# Patient Record
Sex: Female | Born: 1960 | Race: White | Hispanic: No | Smoking: Former smoker
Health system: Southern US, Community
[De-identification: ages and names within clinical notes are randomized; demographics above are authoritative.]

## PROBLEM LIST (undated history)

## (undated) DIAGNOSIS — F32A Depression, unspecified: Secondary | ICD-10-CM

## (undated) DIAGNOSIS — F419 Anxiety disorder, unspecified: Secondary | ICD-10-CM

## (undated) DIAGNOSIS — G43909 Migraine, unspecified, not intractable, without status migrainosus: Secondary | ICD-10-CM

## (undated) DIAGNOSIS — N920 Excessive and frequent menstruation with regular cycle: Secondary | ICD-10-CM

## (undated) DIAGNOSIS — F329 Major depressive disorder, single episode, unspecified: Secondary | ICD-10-CM

## (undated) HISTORY — PX: TUBAL LIGATION: SHX77

## (undated) HISTORY — PX: DILATION AND CURETTAGE OF UTERUS: SHX78

---

## 2001-11-04 ENCOUNTER — Ambulatory Visit (HOSPITAL_COMMUNITY): Admission: RE | Admit: 2001-11-04 | Discharge: 2001-11-04 | Payer: Self-pay | Admitting: Family Medicine

## 2001-11-04 ENCOUNTER — Encounter: Payer: Self-pay | Admitting: Family Medicine

## 2007-08-29 ENCOUNTER — Emergency Department (HOSPITAL_COMMUNITY): Admission: EM | Admit: 2007-08-29 | Discharge: 2007-08-29 | Payer: Self-pay | Admitting: Emergency Medicine

## 2008-11-27 ENCOUNTER — Emergency Department (HOSPITAL_COMMUNITY): Admission: EM | Admit: 2008-11-27 | Discharge: 2008-11-27 | Payer: Self-pay | Admitting: Emergency Medicine

## 2008-11-29 ENCOUNTER — Emergency Department (HOSPITAL_COMMUNITY): Admission: EM | Admit: 2008-11-29 | Discharge: 2008-11-29 | Payer: Self-pay | Admitting: Emergency Medicine

## 2009-11-12 ENCOUNTER — Encounter (HOSPITAL_COMMUNITY): Admission: RE | Admit: 2009-11-12 | Discharge: 2009-12-12 | Payer: Self-pay | Admitting: Preventative Medicine

## 2010-01-03 ENCOUNTER — Ambulatory Visit (HOSPITAL_COMMUNITY): Admission: RE | Admit: 2010-01-03 | Discharge: 2010-01-03 | Payer: Self-pay | Admitting: Internal Medicine

## 2010-06-08 ENCOUNTER — Encounter: Payer: Self-pay | Admitting: Internal Medicine

## 2010-07-31 ENCOUNTER — Inpatient Hospital Stay (INDEPENDENT_AMBULATORY_CARE_PROVIDER_SITE_OTHER)
Admission: RE | Admit: 2010-07-31 | Discharge: 2010-07-31 | Disposition: A | Discharge status: Home / Self Care | Payer: 59 | Source: Ambulatory Visit | Attending: Family Medicine | Admitting: Family Medicine

## 2010-07-31 DIAGNOSIS — N390 Urinary tract infection, site not specified: Secondary | ICD-10-CM

## 2010-07-31 LAB — POCT URINALYSIS DIPSTICK
Bilirubin Urine: NEGATIVE
Glucose, UA: NEGATIVE mg/dL
Hgb urine dipstick: NEGATIVE
Ketones, ur: NEGATIVE mg/dL
Nitrite: NEGATIVE
Protein, ur: NEGATIVE mg/dL
Specific Gravity, Urine: 1.01 (ref 1.005–1.030)
Urobilinogen, UA: 0.2 mg/dL (ref 0.0–1.0)
pH: 6 (ref 5.0–8.0)

## 2010-08-01 LAB — URINE CULTURE
Colony Count: NO GROWTH
Culture  Setup Time: 201203151457
Culture: NO GROWTH

## 2011-02-10 LAB — CBC
HCT: 30.4 — ABNORMAL LOW
Hemoglobin: 10.5 — ABNORMAL LOW
MCHC: 34.5
MCV: 76.8 — ABNORMAL LOW
Platelets: 181
RBC: 3.95
RDW: 14.9
WBC: 7.4

## 2011-02-10 LAB — URINALYSIS, ROUTINE W REFLEX MICROSCOPIC
Bilirubin Urine: NEGATIVE
Glucose, UA: NEGATIVE
Hgb urine dipstick: NEGATIVE
Ketones, ur: NEGATIVE
Nitrite: NEGATIVE
Protein, ur: NEGATIVE
Specific Gravity, Urine: 1.025
Urobilinogen, UA: 0.2
pH: 6

## 2011-02-10 LAB — COMPREHENSIVE METABOLIC PANEL
ALT: 16
AST: 23
Albumin: 3.4 — ABNORMAL LOW
Alkaline Phosphatase: 68
BUN: 13
CO2: 25
Calcium: 8.9
Chloride: 103
Creatinine, Ser: 0.69
GFR calc Af Amer: >60
GFR calc non Af Amer: >60
Glucose, Bld: 101 — ABNORMAL HIGH
Potassium: 3.5
Sodium: 135
Total Bilirubin: 0.6
Total Protein: 6.8

## 2011-02-10 LAB — DIFFERENTIAL
Basophils Absolute: 0
Basophils Relative: 0
Eosinophils Absolute: 0.1
Eosinophils Relative: 1
Lymphocytes Relative: 19
Lymphs Abs: 1.4
Monocytes Absolute: 0.7
Monocytes Relative: 9
Neutro Abs: 5.2
Neutrophils Relative %: 70

## 2011-03-06 ENCOUNTER — Emergency Department (HOSPITAL_COMMUNITY)
Admission: EM | Admit: 2011-03-06 | Discharge: 2011-03-06 | Disposition: A | Discharge status: Home / Self Care | Payer: 59 | Attending: Emergency Medicine | Admitting: Emergency Medicine

## 2011-03-06 DIAGNOSIS — M542 Cervicalgia: Secondary | ICD-10-CM | POA: Insufficient documentation

## 2011-03-06 DIAGNOSIS — M62838 Other muscle spasm: Secondary | ICD-10-CM | POA: Insufficient documentation

## 2011-11-04 ENCOUNTER — Encounter (HOSPITAL_COMMUNITY): Payer: Self-pay

## 2011-11-04 ENCOUNTER — Inpatient Hospital Stay (HOSPITAL_COMMUNITY): Payer: 59

## 2011-11-04 ENCOUNTER — Inpatient Hospital Stay (HOSPITAL_COMMUNITY)
Admission: AD | Admit: 2011-11-04 | Discharge: 2011-11-04 | Disposition: A | Discharge status: Home / Self Care | Payer: 59 | Source: Ambulatory Visit | Attending: Obstetrics & Gynecology | Admitting: Obstetrics & Gynecology

## 2011-11-04 DIAGNOSIS — D649 Anemia, unspecified: Secondary | ICD-10-CM | POA: Insufficient documentation

## 2011-11-04 DIAGNOSIS — N920 Excessive and frequent menstruation with regular cycle: Secondary | ICD-10-CM | POA: Insufficient documentation

## 2011-11-04 DIAGNOSIS — N921 Excessive and frequent menstruation with irregular cycle: Secondary | ICD-10-CM

## 2011-11-04 DIAGNOSIS — D259 Leiomyoma of uterus, unspecified: Secondary | ICD-10-CM | POA: Insufficient documentation

## 2011-11-04 HISTORY — DX: Major depressive disorder, single episode, unspecified: F32.9

## 2011-11-04 HISTORY — DX: Depression, unspecified: F32.A

## 2011-11-04 HISTORY — DX: Anxiety disorder, unspecified: F41.9

## 2011-11-04 HISTORY — DX: Migraine, unspecified, not intractable, without status migrainosus: G43.909

## 2011-11-04 LAB — CBC
HCT: 32.9 % — ABNORMAL LOW (ref 36.0–46.0)
Hemoglobin: 10.7 g/dL — ABNORMAL LOW (ref 12.0–15.0)
MCH: 27.3 pg (ref 26.0–34.0)
MCHC: 32.5 g/dL (ref 30.0–36.0)
MCV: 83.9 fL (ref 78.0–100.0)
Platelets: 218 10*3/uL (ref 150–400)
RBC: 3.92 MIL/uL (ref 3.87–5.11)
RDW: 14.2 % (ref 11.5–15.5)
WBC: 4.9 10*3/uL (ref 4.0–10.5)

## 2011-11-04 LAB — WET PREP, GENITAL
Clue Cells Wet Prep HPF POC: NONE SEEN
Trich, Wet Prep: NONE SEEN
Yeast Wet Prep HPF POC: NONE SEEN

## 2011-11-04 MED ORDER — ONDANSETRON 8 MG PO TBDP
8.0000 mg | ORAL_TABLET | Freq: Once | ORAL | Status: AC
  Administered 2011-11-04: 8 mg via ORAL
  Filled 2011-11-04: qty 1

## 2011-11-04 MED ORDER — IBUPROFEN 600 MG PO TABS: 600.0000 mg | ORAL_TABLET | Freq: Four times a day (QID) | ORAL | Status: AC | PRN

## 2011-11-04 MED ORDER — KETOROLAC TROMETHAMINE 60 MG/2ML IM SOLN
60.0000 mg | Freq: Once | INTRAMUSCULAR | Status: AC
  Administered 2011-11-04: 60 mg via INTRAMUSCULAR
  Filled 2011-11-04: qty 2

## 2011-11-04 MED ORDER — OXYCODONE-ACETAMINOPHEN 5-325 MG PO TABS
1.0000 | ORAL_TABLET | Freq: Once | ORAL | Status: AC
  Administered 2011-11-04: 1 via ORAL
  Filled 2011-11-04: qty 1

## 2011-11-04 MED ORDER — MEDROXYPROGESTERONE ACETATE 10 MG PO TABS: 10.0000 mg | ORAL_TABLET | Freq: Every day | ORAL | Status: DC

## 2011-11-04 MED ORDER — OXYCODONE-ACETAMINOPHEN 5-325 MG PO TABS: 2.0000 | ORAL_TABLET | Freq: Four times a day (QID) | ORAL | Status: AC | PRN

## 2011-11-04 NOTE — Discharge Instructions (Signed)
Continue over the counter iron tablets you have been taking.  Get your prescriptions filled and take as directed  Expect the GYN clinic to call you with an appointment.  Drink at least 8 8-oz glasses of water every day.

## 2011-11-04 NOTE — MAU Provider Note (Signed)
History     CSN: [id number]  Arrival date and time: 11/04/11 1030   First Provider Initiated Contact with Patient 11/04/11 1053      Chief Complaint  Patient presents with  . Vaginal Bleeding   HPI Krista Osborn 51 y.o. Comes to MAU with continual vaginal bleeding since before May 26.  Has a primary care MD who has tried Provera for her and the bleeding did slow but has not stopped.  Finished 10 days of provera and bleeding was worse this week so client came to MAU as bleeding was concerning to her.    OB History    Grav Para Term Preterm Abortions TAB SAB Ect Mult Living   5 4   1  1   4       Past Medical History  Diagnosis Date  . Depression   . Migraines   . Anxiety     Past Surgical History  Procedure Date  . Dilation and curettage of uterus   . Tubal ligation     No family history on file.  History  Substance Use Topics  . Smoking status: Former Games developer  . Smokeless tobacco: Not on file  . Alcohol Use:     Allergies: No Known Allergies  Prescriptions prior to admission  Medication Sig Dispense Refill  . ALPRAZolam (XANAX) 0.5 MG tablet Take 0.5 mg by mouth at bedtime as needed. Takes for anxiety      . citalopram (CELEXA) 20 MG tablet Take 20 mg by mouth daily.      Marland Kitchen oxyCODONE-acetaminophen (PERCOCET) 5-325 MG per tablet Take 1 tablet by mouth every 4 (four) hours as needed. Takes for pain        Review of Systems  Gastrointestinal: Positive for nausea and abdominal pain. Negative for vomiting.  Genitourinary:       Vaginal bleeding   Physical Exam   Blood pressure 105/74, pulse 108, temperature 98.5 F (36.9 C), temperature source Oral, resp. rate 18, height 5' 7.75" (1.721 m), weight 166 lb 9.6 oz (75.569 kg), last menstrual period 10/12/2011.  Physical Exam  Nursing note and vitals reviewed. Constitutional: She is oriented to person, place, and time. She appears well-developed and well-nourished.  HENT:  Head: Normocephalic.  Eyes: EOM  are normal.  Neck: Neck supple.  GI: Soft. There is tenderness. There is no rebound and no guarding.       Mild low midline tenderness  Genitourinary:       Speculum exam: Vagina - small to mod amount of bleeding, no odor Cervix - No active bleeding Bimanual exam: Cervix closed Uterus tender to palpation, retroverted and difficult to size Adnexa non tender, no masses bilaterally GC/Chlam, wet prep done Chaperone present for exam.  Musculoskeletal: Normal range of motion.  Neurological: She is alert and oriented to person, place, and time.  Skin: Skin is warm and dry.  Psychiatric: She has a normal mood and affect.    MAU Course  Procedures Results for orders placed during the hospital encounter of 11/04/11 (from the past 24 hour(s))  CBC     Status: Abnormal   Collection Time   11/04/11 11:06 AM      Component Value Range   WBC 4.9  4.0 - 10.5 K/uL   RBC 3.92  3.87 - 5.11 MIL/uL   Hemoglobin 10.7 (*) 12.0 - 15.0 g/dL   HCT 40.9 (*) 81.1 - 91.4 %   MCV 83.9  78.0 - 100.0 fL   MCH 27.3  26.0 - 34.0 pg   MCHC 32.5  30.0 - 36.0 g/dL   RDW 16.1  09.6 - 04.5 %   Platelets 218  150 - 400 K/uL    MDM Clinical Data: Vaginal bleeding and pelvic pain. Remote tubal  ligation.  TRANSABDOMINAL AND TRANSVAGINAL ULTRASOUND OF PELVIS  Technique: Both transabdominal and transvaginal ultrasound  examinations of the pelvis were performed. Transabdominal technique  was performed for global imaging of the pelvis including uterus,  ovaries, adnexal regions, and pelvic cul-de-sac.  It was necessary to proceed with endovaginal exam following the  transabdominal exam to visualize the right ovary and endometrium.  Comparison: None  Findings:  Uterus: Retroverted, retroflexed. 10.0 x 7.4 x 5.7 cm. Posterior  uterine body intramural / submucosal probable fibroid measures 3.4  x 3.0 x 1.7 cm.  Endometrium: Focal thickening is noted within the midbody uterine endometrial canal measuring 2.0 x  1.5 x 1.5 cm, with suggestion of  internal color flow.  Right ovary: 4.4 x 2.2 x 1.8 cm. Normal.  Left ovary: 6.0 x 2.6 x 2.2 cm. Normal.  Other findings: No free fluid  IMPRESSION:  Possible endometrial filling defect which could represent a polyp,  submucosal fibroid, or neoplasia/hyperplasia. Further evaluation  with sonohysterography is recommended if hysteroscopy is not  planned.  Probable uterine fibroid.   Toradol 60 mg IM did not help abdominal pain.  Gave Zofran 8 mg ODT for nausea and will given Percocent 1 tablet by mouth for pain.  Assessment and Plan  Menometrorrhagia Probable uterine fibroid Mild anemia  Plan Continue over the counter iron tablets you have been taking. Get your prescriptions filled and take as directed Expect the GYN clinic to call you with an appointment. Drink at least 8 8-oz glasses of water every day. Will prescribe Provera 10 mg PO daily x 30 days (1 refill) Will prescribe Percocet 1 po q 6 hours as needed for severe pain (#20) no refills Will prescribe ibuprofen 600 mg PO q 6 h PRN for pain.   Serapio Edelson 11/04/2011, 1:40 PM

## 2011-11-04 NOTE — MAU Note (Signed)
Patient presents with c/o vaginal bleeding changing a pad every 30 minutes

## 2011-11-04 NOTE — MAU Note (Signed)
Onset of excessive vaginal bleeding two days ago has been bleeding since 5/27 did 10 days of Progesterone early June which slowed flow but did not stop it, cramping, took a Percocet last night which helps.

## 2011-11-05 LAB — GC/CHLAMYDIA PROBE AMP, GENITAL
Chlamydia, DNA Probe: NEGATIVE
GC Probe Amp, Genital: NEGATIVE

## 2011-11-25 ENCOUNTER — Ambulatory Visit (INDEPENDENT_AMBULATORY_CARE_PROVIDER_SITE_OTHER): Payer: 59 | Admitting: Obstetrics & Gynecology

## 2011-11-25 ENCOUNTER — Encounter: Payer: Self-pay | Admitting: Obstetrics & Gynecology

## 2011-11-25 VITALS — BP 115/81 | HR 83 | Ht 67.75 in | Wt 168.0 lb

## 2011-11-25 DIAGNOSIS — Z01419 Encounter for gynecological examination (general) (routine) without abnormal findings: Secondary | ICD-10-CM

## 2011-11-25 DIAGNOSIS — N920 Excessive and frequent menstruation with regular cycle: Secondary | ICD-10-CM | POA: Insufficient documentation

## 2011-11-25 HISTORY — DX: Excessive and frequent menstruation with regular cycle: N92.0

## 2011-11-25 LAB — POCT PREGNANCY, URINE: Preg Test, Ur: NEGATIVE

## 2011-11-25 NOTE — Progress Notes (Signed)
Patient ID: Krista Osborn, female   DOB: 1961-01-28, 51 y.o.   MRN: [id number]  Chief Complaint  Patient presents with  . Menorrhagia    HPI Krista Osborn is a 51 y.o. female.  Patient is referred from maternity admissions after a visit at the end of June for heavy bleeding using both a pad and tampon every hour. Her bleeding stopped for about 2 weeks and she has started a light menstrual period. Ultrasound was abnormal with a 2 cm lesion that could be a fibroid polyp or hyperplasia. She's had dysmenorrhea. She was prescribed Percocet. HPI  Past Medical History  Diagnosis Date  . Depression   . Migraines   . Anxiety     Past Surgical History  Procedure Date  . Dilation and curettage of uterus   . Tubal ligation     History reviewed. No pertinent family history.  Social History History  Substance Use Topics  . Smoking status: Former Games developer  . Smokeless tobacco: Not on file  . Alcohol Use:     No Known Allergies  Current Outpatient Prescriptions  Medication Sig Dispense Refill  . ALPRAZolam (XANAX) 0.5 MG tablet Take 0.5 mg by mouth at bedtime as needed. Takes for anxiety      . buPROPion (WELLBUTRIN SR) 150 MG 12 hr tablet Take 150 mg by mouth daily.      . citalopram (CELEXA) 20 MG tablet Take 20 mg by mouth daily.      . medroxyPROGESTERone (PROVERA) 10 MG tablet Take 1 tablet (10 mg total) by mouth daily.  30 tablet  1  . oxyCODONE-acetaminophen (PERCOCET) 5-325 MG per tablet Take 1 tablet by mouth every 4 (four) hours as needed. Takes for pain        Review of Systems Review of Systems  Constitutional: Negative.   Respiratory: Negative.   Cardiovascular: Negative.   Genitourinary: Positive for vaginal bleeding.    Blood pressure 115/81, pulse 83, height 5' 7.75" (1.721 m), weight 168 lb (76.204 kg), last menstrual period 11/24/2011.  Physical Exam Physical Exam  Constitutional: She is oriented to person, place, and time. She appears well-developed and  well-nourished.  Abdominal: Soft. She exhibits no mass. There is no tenderness.  Genitourinary: Vagina normal and uterus normal.       Uterus retroverted no adnexal masses or tenderness. Pap smear obtained  Neurological: She is alert and oriented to person, place, and time. Cranial nerve deficit: some facial asymmetry is notedpossibly consistent with Bell's palsy.  Skin: Skin is warm and dry.  Psychiatric: She has a normal mood and affect. Her behavior is normal.    Data Reviewed Pelvic ultrasound  Assessment    Menometrorrhagia with a endometrial lesion seen on ultrasound. I will schedule hysteroscopy and dilation and curettage as an outpatient. Procedure was explained and her questions were answered.  Dr. Scheryl Darter 11/25/2011 5:03 PM     Plan    Hysteroscopy and D&C       Brunilda Eble 11/25/2011, 4:58 PM

## 2011-11-25 NOTE — Patient Instructions (Signed)
Hysteroscopy Hysteroscopy is a procedure used for looking inside the womb (uterus). It may be done for many different reasons, including:  To evaluate abnormal bleeding, fibroid (benign, noncancerous) tumors, polyps, scar tissue (adhesions), and possibly cancer of the uterus.   To look for lumps (tumors) and other uterine growths.   To look for causes of why a woman cannot get pregnant (infertility), causes of recurrent loss of pregnancy (miscarriages), or a lost intrauterine device (IUD).   To perform a sterilization by blocking the fallopian tubes from inside the uterus.  A hysteroscopy should be done right after a menstrual period to be sure you are not pregnant. LET YOUR CAREGIVER KNOW ABOUT:   Allergies.   Medicines taken, including herbs, eyedrops, over-the-counter medicines, and creams.   Use of steroids (by mouth or creams).   Previous problems with anesthetics or numbing medicines.   History of bleeding or blood problems.   History of blood clots.   Possibility of pregnancy, if this applies.   Previous surgery.   Other health problems.  RISKS AND COMPLICATIONS   Putting a hole in the uterus.   Excessive bleeding.   Infection.   Damage to the cervix.   Injury to other organs.   Allergic reaction to medicines.   Too much fluid used in the uterus for the procedure.  BEFORE THE PROCEDURE   Do not take aspirin or blood thinners for a week before the procedure, or as directed. It can cause bleeding.   Arrive at least 60 minutes before the procedure or as directed to read and sign the necessary forms.   Arrange for someone to take you home after the procedure.   If you smoke, do not smoke for 2 weeks before the procedure.  PROCEDURE   Your caregiver may give you medicine to relax you. He or she may also give you a medicine that numbs the area around the cervix (local anesthetic) or a medicine that makes you sleep (general anesthesia).   Sometimes, a  medicine is placed in the cervix the day before the procedure. This medicine makes the cervix have a larger opening (dilate). This makes it easier for the instrument to be inserted into the uterus.   A small instrument (hysteroscope) is inserted through the vagina into the uterus. This instrument is similar to a pencil-sized telescope with a light.   During the procedure, air or a liquid is put into the uterus, which allows the surgeon to see better.   Sometimes, tissue is gently scraped from inside the uterus. These tissue samples are sent to a specialist who looks at tissue samples (pathologist). The pathologist will give a report to your caregiver. This will help your caregiver decide if further treatment is necessary. The report will also help your caregiver decide on the best treatment if the test comes back abnormal.  AFTER THE PROCEDURE   If you had a general anesthetic, you may be groggy for a couple hours after the procedure.   If you had a local anesthetic, you will be advised to rest at the surgical center or caregiver's office until you are stable and feel ready to go home.   You may have some cramping for a couple days.   You may have bleeding, which varies from light spotting for a few days to menstrual-like bleeding for up to 3 to 7 days. This is normal.   Have someone take you home.  FINDING OUT THE RESULTS OF YOUR TEST Not all test results   are available during your visit. If your test results are not back during the visit, make an appointment with your caregiver to find out the results. Do not assume everything is normal if you have not heard from your caregiver or the medical facility. It is important for you to follow up on all of your test results. HOME CARE INSTRUCTIONS   Do not drive for 24 hours or as instructed.   Only take over-the-counter or prescription medicines for pain, discomfort, or fever as directed by your caregiver.   Do not take aspirin. It can cause or  aggravate bleeding.   Do not drive or drink alcohol while taking pain medicine.   You may resume your usual diet.   Do not use tampons, douche, or have sexual intercourse for 2 weeks, or as advised by your caregiver.   Rest and sleep for the first 24 to 48 hours.   Take your temperature twice a day for 4 to 5 days. Write it down. Give these temperatures to your caregiver if they are abnormal (above 98.6 F or 37.0 C).   Take medicines your caregiver has ordered as directed.   Follow your caregiver's advice regarding diet, exercise, lifting, driving, and general activities.   Take showers instead of baths for 2 weeks, or as recommended by your caregiver.   If you develop constipation:   Take a mild laxative with the advice of your caregiver.   Eat bran foods.   Drink enough water and fluids to keep your urine clear or pale yellow.   Try to have someone with you or available to you for the first 24 to 48 hours, especially if you had a general anesthetic.   Make sure you and your family understand everything about your operation and recovery.   Follow your caregiver's advice regarding follow-up appointments and Pap smears.  SEEK MEDICAL CARE IF:   You feel dizzy or lightheaded.   You feel sick to your stomach (nauseous).   You develop abnormal vaginal discharge.   You develop a rash.   You have an abnormal reaction or allergy to your medicine.   You need stronger pain medicine.  SEEK IMMEDIATE MEDICAL CARE IF:   Bleeding is heavier than a normal menstrual period or you have blood clots.   You have an oral temperature above 102 F (38.9 C), not controlled by medicine.   You have increasing cramps or pains not relieved with medicine.   You develop belly (abdominal) pain that does not seem to be related to the same area of earlier cramping and pain.   You pass out.   You develop pain in the tops of your shoulders (shoulder strap areas).   You develop shortness of  breath.  MAKE SURE YOU:   Understand these instructions.   Will watch your condition.   Will get help right away if you are not doing well or get worse.  Document Released: 08/10/2000 Document Revised: 04/23/2011 Document Reviewed: 12/03/2008 [employer] Patient Information 2012 [employer]. 

## 2011-12-11 ENCOUNTER — Other Ambulatory Visit: Payer: Self-pay | Admitting: Obstetrics & Gynecology

## 2011-12-28 ENCOUNTER — Ambulatory Visit (HOSPITAL_COMMUNITY)
Admission: RE | Admit: 2011-12-28 | Discharge: 2011-12-28 | Disposition: A | Discharge status: Home / Self Care | Payer: 59 | Source: Ambulatory Visit | Attending: Family Medicine | Admitting: Family Medicine

## 2011-12-28 DIAGNOSIS — M7989 Other specified soft tissue disorders: Secondary | ICD-10-CM

## 2011-12-28 DIAGNOSIS — M79606 Pain in leg, unspecified: Secondary | ICD-10-CM

## 2011-12-28 DIAGNOSIS — M79609 Pain in unspecified limb: Secondary | ICD-10-CM | POA: Insufficient documentation

## 2011-12-28 NOTE — Progress Notes (Signed)
*  PRELIMINARY RESULTS* Vascular Ultrasound Left lower extremity venous duplex has been completed.  Preliminary findings: Left= no evidence of DVT or baker's cyst.  Farrel Demark, RDMS, RVT  12/28/2011, 10:58 AM

## 2011-12-29 ENCOUNTER — Encounter (HOSPITAL_COMMUNITY): Payer: Self-pay | Admitting: Pharmacist

## 2012-01-11 ENCOUNTER — Ambulatory Visit (HOSPITAL_COMMUNITY)
Admission: RE | Admit: 2012-01-11 | Discharge: 2012-01-11 | Disposition: A | Discharge status: Home / Self Care | Payer: 59 | Source: Ambulatory Visit | Attending: Obstetrics & Gynecology | Admitting: Obstetrics & Gynecology

## 2012-01-11 ENCOUNTER — Encounter (HOSPITAL_COMMUNITY): Payer: Self-pay | Admitting: Obstetrics & Gynecology

## 2012-01-11 ENCOUNTER — Ambulatory Visit (HOSPITAL_COMMUNITY): Payer: 59 | Admitting: Anesthesiology

## 2012-01-11 ENCOUNTER — Encounter (HOSPITAL_COMMUNITY): Payer: Self-pay | Admitting: Anesthesiology

## 2012-01-11 ENCOUNTER — Encounter (HOSPITAL_COMMUNITY)
Admission: RE | Disposition: A | Discharge status: Home / Self Care | Payer: Self-pay | Source: Ambulatory Visit | Attending: Obstetrics & Gynecology

## 2012-01-11 DIAGNOSIS — N949 Unspecified condition associated with female genital organs and menstrual cycle: Secondary | ICD-10-CM | POA: Insufficient documentation

## 2012-01-11 DIAGNOSIS — D250 Submucous leiomyoma of uterus: Secondary | ICD-10-CM

## 2012-01-11 DIAGNOSIS — N938 Other specified abnormal uterine and vaginal bleeding: Secondary | ICD-10-CM | POA: Insufficient documentation

## 2012-01-11 DIAGNOSIS — N925 Other specified irregular menstruation: Secondary | ICD-10-CM

## 2012-01-11 HISTORY — DX: Excessive and frequent menstruation with regular cycle: N92.0

## 2012-01-11 HISTORY — PX: HYSTEROSCOPY WITH D & C: SHX1775

## 2012-01-11 LAB — CBC
HCT: 32.5 % — ABNORMAL LOW (ref 36.0–46.0)
Hemoglobin: 10.2 g/dL — ABNORMAL LOW (ref 12.0–15.0)
MCH: 24.1 pg — ABNORMAL LOW (ref 26.0–34.0)
MCHC: 31.4 g/dL (ref 30.0–36.0)
MCV: 76.8 fL — ABNORMAL LOW (ref 78.0–100.0)
Platelets: 238 10*3/uL (ref 150–400)
RBC: 4.23 MIL/uL (ref 3.87–5.11)
RDW: 15.3 % (ref 11.5–15.5)
WBC: 5.1 10*3/uL (ref 4.0–10.5)

## 2012-01-11 LAB — PREGNANCY, URINE: Preg Test, Ur: NEGATIVE

## 2012-01-11 SURGERY — DILATATION AND CURETTAGE /HYSTEROSCOPY
Anesthesia: General | Site: Vagina | Wound class: Clean Contaminated

## 2012-01-11 MED ORDER — FENTANYL CITRATE 0.05 MG/ML IJ SOLN
INTRAMUSCULAR | Status: AC
  Filled 2012-01-11: qty 2

## 2012-01-11 MED ORDER — DEXAMETHASONE SODIUM PHOSPHATE 10 MG/ML IJ SOLN
INTRAMUSCULAR | Status: AC
  Filled 2012-01-11: qty 1

## 2012-01-11 MED ORDER — PROPOFOL 10 MG/ML IV EMUL
INTRAVENOUS | Status: AC
  Filled 2012-01-11: qty 20

## 2012-01-11 MED ORDER — FENTANYL CITRATE 0.05 MG/ML IJ SOLN
25.0000 ug | INTRAMUSCULAR | Status: DC | PRN
  Administered 2012-01-11 (×2): 50 ug via INTRAVENOUS

## 2012-01-11 MED ORDER — KETOROLAC TROMETHAMINE 60 MG/2ML IM SOLN
INTRAMUSCULAR | Status: DC | PRN
  Administered 2012-01-11: 30 mg via INTRAMUSCULAR

## 2012-01-11 MED ORDER — OXYCODONE-ACETAMINOPHEN 5-325 MG PO TABS
ORAL_TABLET | ORAL | Status: AC
  Administered 2012-01-11: 2 via ORAL
  Filled 2012-01-11: qty 2

## 2012-01-11 MED ORDER — LACTATED RINGERS IV SOLN: INTRAVENOUS | Status: DC

## 2012-01-11 MED ORDER — KETOROLAC TROMETHAMINE 30 MG/ML IJ SOLN
INTRAMUSCULAR | Status: DC | PRN
  Administered 2012-01-11: 30 mg via INTRAVENOUS

## 2012-01-11 MED ORDER — MIDAZOLAM HCL 2 MG/2ML IJ SOLN
INTRAMUSCULAR | Status: AC
  Filled 2012-01-11: qty 2

## 2012-01-11 MED ORDER — MIDAZOLAM HCL 5 MG/5ML IJ SOLN
INTRAMUSCULAR | Status: DC | PRN
  Administered 2012-01-11: 2 mg via INTRAVENOUS

## 2012-01-11 MED ORDER — OXYCODONE-ACETAMINOPHEN 5-325 MG PO TABS
2.0000 | ORAL_TABLET | Freq: Once | ORAL | Status: AC
  Administered 2012-01-11: 2 via ORAL

## 2012-01-11 MED ORDER — LACTATED RINGERS IV SOLN
INTRAVENOUS | Status: DC
  Administered 2012-01-11 (×2): via INTRAVENOUS

## 2012-01-11 MED ORDER — LIDOCAINE HCL (CARDIAC) 20 MG/ML IV SOLN
INTRAVENOUS | Status: DC | PRN
  Administered 2012-01-11: 60 mg via INTRAVENOUS

## 2012-01-11 MED ORDER — GLYCINE 1.5 % IR SOLN
Status: DC | PRN
  Administered 2012-01-11 (×4): 3000 mL

## 2012-01-11 MED ORDER — PROPOFOL 10 MG/ML IV EMUL
INTRAVENOUS | Status: DC | PRN
  Administered 2012-01-11: 40 mg via INTRAVENOUS
  Administered 2012-01-11: 160 mg via INTRAVENOUS

## 2012-01-11 MED ORDER — OXYCODONE-ACETAMINOPHEN 5-325 MG PO TABS: 1.0000 | ORAL_TABLET | Freq: Four times a day (QID) | ORAL | Status: AC | PRN

## 2012-01-11 MED ORDER — BUPIVACAINE-EPINEPHRINE 0.5% -1:200000 IJ SOLN
INTRAMUSCULAR | Status: DC | PRN
  Administered 2012-01-11: 30 mL

## 2012-01-11 MED ORDER — FENTANYL CITRATE 0.05 MG/ML IJ SOLN
INTRAMUSCULAR | Status: DC | PRN
  Administered 2012-01-11 (×2): 50 ug via INTRAVENOUS
  Administered 2012-01-11: 100 ug via INTRAVENOUS
  Administered 2012-01-11 (×2): 50 ug via INTRAVENOUS

## 2012-01-11 MED ORDER — FENTANYL CITRATE 0.05 MG/ML IJ SOLN
INTRAMUSCULAR | Status: AC
  Administered 2012-01-11: 50 ug via INTRAVENOUS
  Filled 2012-01-11: qty 2

## 2012-01-11 MED ORDER — LIDOCAINE HCL (CARDIAC) 20 MG/ML IV SOLN
INTRAVENOUS | Status: AC
  Filled 2012-01-11: qty 5

## 2012-01-11 MED ORDER — ONDANSETRON HCL 4 MG/2ML IJ SOLN
INTRAMUSCULAR | Status: DC | PRN
  Administered 2012-01-11: 4 mg via INTRAVENOUS

## 2012-01-11 MED ORDER — KETOROLAC TROMETHAMINE 60 MG/2ML IM SOLN
INTRAMUSCULAR | Status: AC
  Filled 2012-01-11: qty 2

## 2012-01-11 MED ORDER — ONDANSETRON HCL 4 MG/2ML IJ SOLN
INTRAMUSCULAR | Status: AC
  Filled 2012-01-11: qty 2

## 2012-01-11 MED ORDER — DEXAMETHASONE SODIUM PHOSPHATE 4 MG/ML IJ SOLN
INTRAMUSCULAR | Status: DC | PRN
  Administered 2012-01-11: 10 mg via INTRAVENOUS

## 2012-01-11 SURGICAL SUPPLY — 19 items
ABLATOR ENDOMETRIAL BIPOLAR (ABLATOR) IMPLANT
CANISTER SUCTION 2500CC (MISCELLANEOUS) ×2 IMPLANT
CATH ROBINSON RED A/P 16FR (CATHETERS) ×2 IMPLANT
CLOTH BEACON ORANGE TIMEOUT ST (SAFETY) ×2 IMPLANT
CONTAINER PREFILL 10% NBF 60ML (FORM) ×4 IMPLANT
CORD ACTIVE DISPOSABLE (ELECTRODE)
CORD ELECTRO ACTIVE DISP (ELECTRODE) IMPLANT
ELECT LOOP GYNE PRO 24FR (CUTTING LOOP) ×2
ELECT REM PT RETURN 9FT ADLT (ELECTROSURGICAL) ×2
ELECTRODE LOOP GYNE PRO 24FR (CUTTING LOOP) ×1 IMPLANT
ELECTRODE REM PT RTRN 9FT ADLT (ELECTROSURGICAL) ×1 IMPLANT
GLOVE BIO SURGEON STRL SZ 6.5 (GLOVE) ×2 IMPLANT
GLOVE BIOGEL PI IND STRL 7.0 (GLOVE) ×2 IMPLANT
GLOVE BIOGEL PI INDICATOR 7.0 (GLOVE) ×2
GOWN PREVENTION PLUS LG XLONG (DISPOSABLE) ×4 IMPLANT
GOWN STRL REIN XL XLG (GOWN DISPOSABLE) ×2 IMPLANT
PACK HYSTEROSCOPY LF (CUSTOM PROCEDURE TRAY) ×2 IMPLANT
TOWEL OR 17X24 6PK STRL BLUE (TOWEL DISPOSABLE) ×4 IMPLANT
WATER STERILE IRR 1000ML POUR (IV SOLUTION) ×2 IMPLANT

## 2012-01-11 NOTE — Anesthesia Procedure Notes (Signed)
Procedure Name: LMA Insertion Date/Time: 01/11/2012 9:37 AM Performed by: Graciela Husbands Pre-anesthesia Checklist: Suction available, Emergency Drugs available, Timeout performed, Patient identified and Patient being monitored Patient Re-evaluated:Patient Re-evaluated prior to inductionOxygen Delivery Method: Circle system utilized Preoxygenation: Pre-oxygenation with 100% oxygen Intubation Type: IV induction LMA: LMA inserted LMA Size: 4.0 Number of attempts: 1 Placement Confirmation: positive ETCO2 and breath sounds checked- equal and bilateral Tube secured with: Tape Dental Injury: Teeth and Oropharynx as per pre-operative assessment

## 2012-01-11 NOTE — Anesthesia Postprocedure Evaluation (Signed)
  Anesthesia Post-op Note  Patient: Krista Osborn  Procedure(s) Performed: Procedure(s) (LRB): DILATATION AND CURETTAGE /HYSTEROSCOPY (N/A)  Patient is awake and responsive. Pain and nausea are reasonably well controlled. Vital signs are stable and clinically acceptable. Oxygen saturation is clinically acceptable. There are no apparent anesthetic complications at this time. Patient is ready for discharge.

## 2012-01-11 NOTE — Anesthesia Preprocedure Evaluation (Signed)
Anesthesia Evaluation  Patient identified by MRN, date of birth, ID band Patient awake    Reviewed: Allergy & Precautions, H&P , Patient's Chart, lab work & pertinent test results, reviewed documented beta blocker date and time   Airway Mallampati: II TM Distance: >3 FB Neck ROM: full    Dental No notable dental hx.    Pulmonary  breath sounds clear to auscultation  Pulmonary exam normal       Cardiovascular Rhythm:regular Rate:Normal     Neuro/Psych  Headaches, PSYCHIATRIC DISORDERS    GI/Hepatic   Endo/Other    Renal/GU      Musculoskeletal   Abdominal   Peds  Hematology   Anesthesia Other Findings   Reproductive/Obstetrics                           Anesthesia Physical Anesthesia Plan  ASA: II  Anesthesia Plan: General   Post-op Pain Management:    Induction: Intravenous  Airway Management Planned: LMA  Additional Equipment:   Intra-op Plan:   Post-operative Plan:   Informed Consent: I have reviewed the patients History and Physical, chart, labs and discussed the procedure including the risks, benefits and alternatives for the proposed anesthesia with the patient or authorized representative who has indicated his/her understanding and acceptance.   Dental Advisory Given  Plan Discussed with: CRNA and Surgeon  Anesthesia Plan Comments: (  Discussed  general anesthesia, including possible nausea, instrumentation of airway, sore throat,pulmonary aspiration, etc. I asked if the were any outstanding questions, or  concerns before we proceeded. )        Anesthesia Quick Evaluation

## 2012-01-11 NOTE — H&P (Signed)
Patient ID: Krista Osborn, female DOB: 03-14-1961, 51 y.o. MRN: [id number]  Chief Complaint   Patient presents with   .  Menorrhagia   HPI   [id number]  Krista Osborn is a 51 y.oPatient's last menstrual period was 09/30/2011.She continues to have spotting since LMP. Patient is referred from maternity admissions after a visit at the end of June for heavy bleeding using both a pad and tampon every hour. Her bleeding stopped for about 2 weeks and she has started a light menstrual period. Ultrasound was abnormal with a 2 cm lesion that could be a fibroid polyp or hyperplasia. She's had dysmenorrhea. She was prescribed Percocet for left shoulder pain.  HPI  Past Medical History   Diagnosis  Date   .  Depression    .  Migraines    .  Anxiety     Past Surgical History   Procedure  Date   .  Dilation and curettage of uterus    .  Tubal ligation    History reviewed. No pertinent family history.  Social History  History   Substance Use Topics   .  Smoking status:  Former Games developer   .  Smokeless tobacco:  Not on file   .  Alcohol Use:    No Known Allergies  Current Outpatient Prescriptions   Medication  Sig  Dispense  Refill   .  ALPRAZolam (XANAX) 0.5 MG tablet  Take 0.5 mg by mouth at bedtime as needed. Takes for anxiety     .  buPROPion (WELLBUTRIN SR) 150 MG 12 hr tablet  Take 150 mg by mouth daily.     .  citalopram (CELEXA) 20 MG tablet  Take 20 mg by mouth daily.     .  medroxyPROGESTERone (PROVERA) 10 MG tablet  Take 1 tablet (10 mg total) by mouth daily.  30 tablet  1   .  oxyCODONE-acetaminophen (PERCOCET) 5-325 MG per tablet  Take 1 tablet by mouth every 4 (four) hours as needed. Takes for pain     Review of Systems  Review of Systems  Constitutional: Negative.  Respiratory: Negative.  Cardiovascular: Negative.  Genitourinary: Positive for vaginal bleeding.   Physical Exam  Filed Vitals:   01/11/12 0839  BP: 102/73  Pulse: 86  Temp: 98.4 F (36.9 C)  Resp: 16     Physical Exam  Constitutional: She is oriented to person, place, and time. She appears well-developed and well-nourished. Mild facial asymmetry. Heart RRR Abdominal: Soft. She exhibits no mass. There is no tenderness.  Genitourinary: Vagina normal and uterus normal.  Uterus retroverted no adnexal masses or tenderness. Pap smear obtained  Neurological: She is alert and oriented to person, place, and time. Cranial nerve deficit: some facial asymmetry is noted possibly consistent with Bell's palsy.  Skin: Skin is warm and dry.  Psychiatric: She has a normal mood and affect. Her behavior is normal.  Data Reviewed  Pelvic ultrasound  *RADIOLOGY REPORT*  Clinical Data: Vaginal bleeding and pelvic pain. Remote tubal  ligation.  TRANSABDOMINAL AND TRANSVAGINAL ULTRASOUND OF PELVIS  Technique: Both transabdominal and transvaginal ultrasound  examinations of the pelvis were performed. Transabdominal technique  was performed for global imaging of the pelvis including uterus,  ovaries, adnexal regions, and pelvic cul-de-sac.  It was necessary to proceed with endovaginal exam following the  transabdominal exam to visualize the right ovary and endometrium.  Comparison: None  Findings:  Uterus: Retroverted, retroflexed. 10.0 x 7.4 x 5.7 cm. Posterior  uterine  body intramural / submucosal probable fibroid measures 3.4  x 3.0 x 1.7 cm.  Endometrium: Focal thickening is noted within the midbody uterine  endometrial canal measuring 2.0 x 1.5 x 1.5 cm, with suggestion of  internal color flow.  Right ovary: 4.4 x 2.2 x 1.8 cm. Normal.  Left ovary: 6.0 x 2.6 x 2.2 cm. Normal.  Other findings: No free fluid  IMPRESSION:  Possible endometrial filling defect which could represent a polyp,  submucosal fibroid, or neoplasia/hyperplasia. Further evaluation  with sonohysterography is recommended if hysteroscopy is not  planned.  Probable uterine fibroid.  Original Report Authenticated By: Harrel Lemon, M.D. Pap 10/29/11 normal CBC    Component Value Date/Time   WBC 4.9 11/04/2011 1106   RBC 3.92 11/04/2011 1106   HGB 10.7* 11/04/2011 1106   HCT 32.9* 11/04/2011 1106   PLT 218 11/04/2011 1106   MCV 83.9 11/04/2011 1106   MCH 27.3 11/04/2011 1106   MCHC 32.5 11/04/2011 1106   RDW 14.2 11/04/2011 1106   LYMPHSABS 1.4 08/29/2007 1335   MONOABS 0.7 08/29/2007 1335   EOSABS 0.1 08/29/2007 1335   BASOSABS 0.0 08/29/2007 1335     Assessment  Menometrorrhagia with a endometrial lesion seen on ultrasound. She is scheduled for hysteroscopy and dilation and curettage as an outpatient. Procedure was explained, the risks and benefits were discussed, and her questions were answered.   Lukka Black 01/11/2012

## 2012-01-11 NOTE — Transfer of Care (Signed)
Immediate Anesthesia Transfer of Care Note  Patient: Krista Osborn  Procedure(s) Performed: Procedure(s) (LRB): DILATATION AND CURETTAGE /HYSTEROSCOPY (N/A)  Patient Location: PACU  Anesthesia Type: General  Level of Consciousness: awake, alert  and oriented  Airway & Oxygen Therapy: Patient Spontanous Breathing and Patient connected to nasal cannula oxygen  Post-op Assessment: Report given to PACU RN and Post -op Vital signs reviewed and stable  Post vital signs: Reviewed and stable  Complications: No apparent anesthesia complications

## 2012-01-11 NOTE — Op Note (Signed)
Procedure: Hysteroscopy, myomectomy Preoperative diagnosis: Dysfunctional uterine bleeding with endometrial lesion Postoperative diagnosis: Dysfunctional uterine bleeding, submucous fibroid Surgeon: Dr. Scheryl Darter Anesthesia: Gen. endotracheal Specimen: Fragments of submucous fibroid Complications: None Drains: None Counts: Correct   Patient gave written consent for hysteroscopy and possible dilation and curettage. Dysfunctional uterine bleeding since May. Ultrasound showed a possible 2 cm endometrial lesion, probable fibroid. Patient identification was confirmed and she was brought the operating room and general endotracheal anesthesia was induced. She was placed in dorsal lithotomy position. Bladder was drained with red rubber catheter. Perineum and vagina were sterilely prepped and draped. Exam revealed normal-size uterus no adnexal masses. Speculum was inserted. Half percent Marcaine with 1 200,000 epinephrine was infiltrated for intracervical block. Cervix was grasped with single-tooth tenaculum. Cervix was dilated and the diagnostic hysteroscope was inserted. A submucous fibroid that was attached to the right side of the endometrial cavity was seen. Appeared to be about 2-3 cm. Both tubal ostia were seen. The diagnostic scope was removed and the resectoscope was used. 1.5% glycine was used to distend the cavity. Cutting loop was used. The fibroid was resected and the fragments were removed. Nearly complete removal of the fibroid was demonstrated. Specimen was sent to pathology. There was a 200 mL deficit of fluid at the end of the case. There was minimal bleeding. Patient was stable throughout procedure. All instruments were removed. She was brought in stable condition to PACU.  Dr. Scheryl Darter 01/11/2012 11:26 AM

## 2012-01-12 ENCOUNTER — Encounter (HOSPITAL_COMMUNITY): Payer: Self-pay | Admitting: Obstetrics & Gynecology

## 2012-02-01 ENCOUNTER — Encounter: Payer: Self-pay | Admitting: Obstetrics & Gynecology

## 2012-02-01 ENCOUNTER — Ambulatory Visit (INDEPENDENT_AMBULATORY_CARE_PROVIDER_SITE_OTHER): Payer: 59 | Admitting: Obstetrics & Gynecology

## 2012-02-01 VITALS — BP 107/72 | HR 118 | Temp 98.3°F | Wt 162.0 lb

## 2012-02-01 DIAGNOSIS — Z09 Encounter for follow-up examination after completed treatment for conditions other than malignant neoplasm: Secondary | ICD-10-CM

## 2012-02-01 DIAGNOSIS — D250 Submucous leiomyoma of uterus: Secondary | ICD-10-CM

## 2012-02-01 NOTE — Progress Notes (Signed)
Patient ID: Krista Osborn, female   DOB: Aug 27, 1960, 51 y.o.   MRN: [id number] Subjective:     Krista Osborn is a 51 y.o. female who presents to the clinic 3 weeks status post myomectomy and operative hysteroscopy for fibroids. Eating a regular diet without difficulty. Bowel movements are normal. She notes right hip pain and other musculoskeletal problems.  The following portions of the patient's history were reviewed and updated as appropriate: allergies, current medications, past family history, past medical history, past social history, past surgical history and problem list.  Review of Systems LMP heavy, now slight spotting    Objective:    BP 107/72  Pulse 118  Temp 98.3 F (36.8 C) (Oral)  Wt 162 lb (73.483 kg) General:  alert, cooperative and no distress  Abdomen: soft, non-tender  Incision:   none    Pelvic: EBUS normal, cervix nl, uterus no mass not tender  Assessment:    Doing well postoperatively. Operative findings again reviewed. Pathology report discussed.    Plan:    1. Continue any current medications. 2. Wound care discussed. 3. Activity restrictions: none 4. Anticipated return to work: not applicable. 5. Follow up: as needed for routine care .   Viviene Thurston 02/01/2012 12:13 PM

## 2012-02-01 NOTE — Patient Instructions (Signed)
Myomectomy Care After  Refer to this sheet in the next few weeks. These instructions provide you with information on caring for yourself after your procedure. Your caregiver may also give you specific instructions. Your treatment has been planned according to current medical practices, but problems sometimes occur. Call your caregiver if you have any problems or questions after your procedure. HOME CARE INSTRUCTIONS   Only take over-the-counter or prescription medicines for pain, discomfort, or fever as directed by your caregiver. Avoid aspirin because it can cause bleeding.   Do not douche, use tampons, or have intercourse until given permission to by your caregiver.   Change your bandage (dressing) as directed.   Do not drive until you are given permission to by your caregiver.   Take showers instead of baths as directed by your caregiver.   If you become constipated, you may take a mild laxative with your caregiver's permission. Eat more bran and drink enough fluids to keep your urine clear or pale yellow.   Take your temperature twice a day and write it down.   Do not drink alcohol.   Do not drive while on pain medicine (narcotics).   Have help at home for 1 week, or until you can do your own household activities.   Keep all follow-up appointments with your caregiver.  SEEK MEDICAL CARE IF:  You develop a temperature of 100 F (37.8 C) or higher.   You have increasing stomach pain and medicine does not help.   You have nausea, vomiting, or diarrhea.   You have pain when you urinate, or you have blood in your urine.   You have a rash on your body.   You have pain or redness where your intravenous (IV) access tube was inserted.   You develop weakness or lightheadedness.   You need stronger pain medicine.   You have a reaction or side effects from your medicines.  SEEK IMMEDIATE MEDICAL CARE IF:   You have pain, swelling, or any kind of drainage from your incision.     You have pain, swelling, or redness in your leg.   You have chest pain.   You faint.   You have shortness of breath.   You have heavy vaginal bleeding.   You see pus coming from the incision.   Your incision is opening up.  MAKE SURE YOU:  Understand these instructions.   Will watch your condition.   Will get help right away if you are not doing well or get worse.  Document Released: 09/24/2010 Document Revised: 04/23/2011 Document Reviewed: 09/24/2010 [facility] Patient Information 2012 [place], [place].

## 2012-02-23 ENCOUNTER — Ambulatory Visit: Payer: 59 | Attending: Orthopedic Surgery

## 2012-02-23 DIAGNOSIS — M25519 Pain in unspecified shoulder: Secondary | ICD-10-CM | POA: Insufficient documentation

## 2012-02-23 DIAGNOSIS — M25619 Stiffness of unspecified shoulder, not elsewhere classified: Secondary | ICD-10-CM | POA: Insufficient documentation

## 2012-02-23 DIAGNOSIS — R293 Abnormal posture: Secondary | ICD-10-CM | POA: Insufficient documentation

## 2012-02-23 DIAGNOSIS — IMO0001 Reserved for inherently not codable concepts without codable children: Secondary | ICD-10-CM | POA: Insufficient documentation

## 2012-02-25 ENCOUNTER — Ambulatory Visit: Payer: 59 | Admitting: Physical Therapy

## 2012-02-29 ENCOUNTER — Ambulatory Visit: Payer: 59 | Admitting: Rehabilitation

## 2012-03-09 ENCOUNTER — Ambulatory Visit: Payer: 59 | Admitting: Physical Therapy

## 2012-03-11 ENCOUNTER — Ambulatory Visit: Payer: 59 | Admitting: Rehabilitation

## 2012-03-16 ENCOUNTER — Encounter: Payer: 59 | Admitting: Physical Therapy

## 2012-03-17 ENCOUNTER — Ambulatory Visit: Payer: 59 | Admitting: Obstetrics & Gynecology

## 2012-03-18 ENCOUNTER — Encounter: Payer: 59 | Admitting: Physical Therapy

## 2012-03-25 ENCOUNTER — Other Ambulatory Visit (HOSPITAL_COMMUNITY): Payer: Self-pay | Admitting: Pain Medicine

## 2012-03-25 ENCOUNTER — Telehealth: Payer: Self-pay | Admitting: Hematology and Oncology

## 2012-03-25 DIAGNOSIS — M542 Cervicalgia: Secondary | ICD-10-CM

## 2012-03-25 DIAGNOSIS — R519 Headache, unspecified: Secondary | ICD-10-CM

## 2012-03-25 NOTE — Telephone Encounter (Signed)
LVOM for pt to return call.  °

## 2012-03-28 ENCOUNTER — Telehealth: Payer: Self-pay | Admitting: Hematology and Oncology

## 2012-03-28 NOTE — Telephone Encounter (Signed)
Pt called in re NP appt 11/26 @ 10 w/Dr. Dalene Carrow.  Referring Dr. Farris Has Dx-Persistent Anemia Welcome packet mailed.

## 2012-03-29 ENCOUNTER — Telehealth: Payer: Self-pay | Admitting: Hematology and Oncology

## 2012-03-29 NOTE — Telephone Encounter (Signed)
C/D 03/29/12 for appt 04/12/12

## 2012-04-01 ENCOUNTER — Ambulatory Visit (HOSPITAL_COMMUNITY)
Admission: RE | Admit: 2012-04-01 | Discharge: 2012-04-01 | Disposition: A | Discharge status: Home / Self Care | Payer: 59 | Source: Ambulatory Visit | Attending: Pain Medicine | Admitting: Pain Medicine

## 2012-04-01 DIAGNOSIS — R519 Headache, unspecified: Secondary | ICD-10-CM

## 2012-04-01 DIAGNOSIS — M5030 Other cervical disc degeneration, unspecified cervical region: Secondary | ICD-10-CM | POA: Insufficient documentation

## 2012-04-01 DIAGNOSIS — M542 Cervicalgia: Secondary | ICD-10-CM

## 2012-04-06 ENCOUNTER — Other Ambulatory Visit (HOSPITAL_COMMUNITY): Payer: 59

## 2012-04-12 ENCOUNTER — Encounter: Payer: Self-pay | Admitting: Hematology and Oncology

## 2012-04-12 ENCOUNTER — Ambulatory Visit (HOSPITAL_BASED_OUTPATIENT_CLINIC_OR_DEPARTMENT_OTHER): Payer: 59

## 2012-04-12 ENCOUNTER — Telehealth: Payer: Self-pay | Admitting: Hematology and Oncology

## 2012-04-12 ENCOUNTER — Ambulatory Visit: Payer: 59

## 2012-04-12 ENCOUNTER — Other Ambulatory Visit: Payer: 59 | Admitting: Lab

## 2012-04-12 ENCOUNTER — Ambulatory Visit (HOSPITAL_BASED_OUTPATIENT_CLINIC_OR_DEPARTMENT_OTHER): Payer: 59 | Admitting: Hematology and Oncology

## 2012-04-12 ENCOUNTER — Ambulatory Visit (HOSPITAL_BASED_OUTPATIENT_CLINIC_OR_DEPARTMENT_OTHER): Payer: 59 | Admitting: Lab

## 2012-04-12 VITALS — BP 121/95 | HR 112 | Temp 97.7°F | Ht 68.0 in | Wt 149.9 lb

## 2012-04-12 VITALS — BP 92/67 | HR 86 | Temp 97.3°F | Resp 20

## 2012-04-12 DIAGNOSIS — N920 Excessive and frequent menstruation with regular cycle: Secondary | ICD-10-CM

## 2012-04-12 DIAGNOSIS — D649 Anemia, unspecified: Secondary | ICD-10-CM

## 2012-04-12 DIAGNOSIS — D539 Nutritional anemia, unspecified: Secondary | ICD-10-CM | POA: Insufficient documentation

## 2012-04-12 LAB — CBC & DIFF AND RETIC
BASO%: 0.6 % (ref 0.0–2.0)
Basophils Absolute: 0 10*3/uL (ref 0.0–0.1)
EOS%: 1.4 % (ref 0.0–7.0)
Eosinophils Absolute: 0.1 10*3/uL (ref 0.0–0.5)
HCT: 39.9 % (ref 34.8–46.6)
HGB: 12.7 g/dL (ref 11.6–15.9)
Immature Retic Fract: 10 % (ref 1.60–10.00)
LYMPH%: 24.5 % (ref 14.0–49.7)
MCH: 24.5 pg — ABNORMAL LOW (ref 25.1–34.0)
MCHC: 31.8 g/dL (ref 31.5–36.0)
MCV: 76.9 fL — ABNORMAL LOW (ref 79.5–101.0)
MONO#: 0.6 10*3/uL (ref 0.1–0.9)
MONO%: 9.3 % (ref 0.0–14.0)
NEUT#: 4.3 10*3/uL (ref 1.5–6.5)
NEUT%: 64.2 % (ref 38.4–76.8)
Platelets: 234 10*3/uL (ref 145–400)
RBC: 5.19 10*6/uL (ref 3.70–5.45)
RDW: 17.3 % — ABNORMAL HIGH (ref 11.2–14.5)
Retic %: 0.91 % (ref 0.70–2.10)
Retic Ct Abs: 47.23 10*3/uL (ref 33.70–90.70)
WBC: 6.7 10*3/uL (ref 3.9–10.3)
lymph#: 1.6 10*3/uL (ref 0.9–3.3)
nRBC: 0 % (ref 0–0)

## 2012-04-12 LAB — URINALYSIS, MICROSCOPIC - CHCC
Bacteria, UA: NEGATIVE
Bilirubin (Urine): NEGATIVE
Blood: NEGATIVE
Glucose: NEGATIVE g/dL
Ketones: NEGATIVE mg/dL
Leukocyte Esterase: NEGATIVE
Nitrite: NEGATIVE
Protein: <30 mg/dL
RBC / HPF: NEGATIVE (ref 0–2)
Specific Gravity, Urine: 1.025 (ref 1.003–1.035)
pH: 6 (ref 4.6–8.0)

## 2012-04-12 LAB — COMPREHENSIVE METABOLIC PANEL (CC13)
ALT: 12 U/L (ref 0–55)
AST: 18 U/L (ref 5–34)
Albumin: 4.2 g/dL (ref 3.5–5.0)
Alkaline Phosphatase: 79 U/L (ref 40–150)
BUN: 14 mg/dL (ref 7.0–26.0)
CO2: 29 mEq/L (ref 22–29)
Calcium: 10.4 mg/dL (ref 8.4–10.4)
Chloride: 101 mEq/L (ref 98–107)
Creatinine: 0.9 mg/dL (ref 0.6–1.1)
Glucose: 90 mg/dl (ref 70–99)
Potassium: 4.6 mEq/L (ref 3.5–5.1)
Sodium: 140 mEq/L (ref 136–145)
Total Bilirubin: 0.32 mg/dL (ref 0.20–1.20)
Total Protein: 8.4 g/dL — ABNORMAL HIGH (ref 6.4–8.3)

## 2012-04-12 MED ORDER — SODIUM CHLORIDE 0.9 % IV SOLN
Freq: Once | INTRAVENOUS | Status: AC
  Administered 2012-04-12: 13:00:00 via INTRAVENOUS

## 2012-04-12 MED ORDER — SODIUM CHLORIDE 0.9 % IV SOLN
1020.0000 mg | Freq: Once | INTRAVENOUS | Status: AC
  Administered 2012-04-12: 1020 mg via INTRAVENOUS
  Filled 2012-04-12: qty 34

## 2012-04-12 NOTE — Patient Instructions (Signed)
Ferumoxytol injection What is this medicine? FERUMOXYTOL is an iron complex. Iron is used to make healthy red blood cells, which carry oxygen and nutrients throughout the body. This medicine is used to treat iron deficiency anemia in people with chronic kidney disease. This medicine may be used for other purposes; ask your health care provider or pharmacist if you have questions. What should I tell my health care provider before I take this medicine? They need to know if you have any of these conditions: -anemia not caused by low iron levels -high levels of iron in the blood -magnetic resonance imaging (MRI) test scheduled -an unusual or allergic reaction to iron, other medicines, foods, dyes, or preservatives -pregnant or trying to get pregnant -breast-feeding How should I use this medicine? This medicine is for infusion into a vein. It is given by a health care professional in a hospital or clinic setting. Talk to your pediatrician regarding the use of this medicine in children. Special care may be needed. Overdosage: If you think you've taken too much of this medicine contact a poison control center or emergency room at once. Overdosage: If you think you have taken too much of this medicine contact a poison control center or emergency room at once. NOTE: This medicine is only for you. Do not share this medicine with others. What if I miss a dose? It is important not to miss your dose. Call your doctor or health care professional if you are unable to keep an appointment. What may interact with this medicine? This medicine may interact with the following medications: -other iron products This list may not describe all possible interactions. Give your health care provider a list of all the medicines, herbs, non-prescription drugs, or dietary supplements you use. Also tell them if you smoke, drink alcohol, or use illegal drugs. Some items may interact with your medicine. What should I watch  for while using this medicine? Visit your doctor or healthcare professional regularly. Tell your doctor or healthcare professional if your symptoms do not start to get better or if they get worse. You may need blood work done while you are taking this medicine. You may need to follow a special diet. Talk to your doctor. Foods that contain iron include: whole grains/cereals, dried fruits, beans, or peas, leafy green vegetables, and organ meats (liver, kidney). What side effects may I notice from receiving this medicine? Side effects that you should report to your doctor or health care professional as soon as possible: -allergic reactions like skin rash, itching or hives, swelling of the face, lips, or tongue -breathing problems -changes in blood pressure -feeling faint or lightheaded, falls -fever or chills -flushing, sweating, or hot feelings -swelling of the ankles or feet Side effects that usually do not require medical attention (Report these to your doctor or health care professional if they continue or are bothersome.): -diarrhea -headache -nausea, vomiting -stomach pain This list may not describe all possible side effects. Call your doctor for medical advice about side effects. You may report side effects to FDA at 1-800-FDA-1088. Where should I keep my medicine? This drug is given in a hospital or clinic and will not be stored at home. NOTE: This sheet is a summary. It may not cover all possible information. If you have questions about this medicine, talk to your doctor, pharmacist, or health care provider.  2012, Elsevier/Gold Standard. (01/25/2008 9:48:25 PM) 

## 2012-04-12 NOTE — Progress Notes (Signed)
Patient monitored 30 minutes post feraheme infusion. Patient has no complaints.

## 2012-04-12 NOTE — Progress Notes (Signed)
Primary MD- Dr. Paulino Rily  [facility]- [place], [place]  Orthopedist- Dr. Candida Peeling to release information to:  Ulyess Blossom- mother Ernestine Conrad- significant other  Preferred contact- cell phone [phone]

## 2012-04-12 NOTE — Progress Notes (Signed)
This office note has been dictated.

## 2012-04-12 NOTE — Patient Instructions (Addendum)
Krista Osborn  [id number]   [facility] - AFTER VISIT SUMMARY   **RECOMMENDATIONS MADE BY THE CONSULTANT AND ANY TEST    RESULTS WILL BE SENT TO YOUR REFERRING DOCTORS.   YOUR EXAM FINDINGS, LABS AND RESULTS WERE DISCUSSED BY YOUR MD TODAY.  YOU CAN GO TO THE [facility] WEB SITE FOR INSTRUCTIONS ON HOW TO ASSESS MY CHART FOR ADDITIONAL INFORMATION AS NEEDED.  Your Updated drug allergies are: Allergies as of 04/12/2012  . (No Known Allergies)    Your current list of medications are: Current Outpatient Prescriptions  Medication Sig Dispense Refill  . ALPRAZolam (XANAX) 0.5 MG tablet Take 0.5 mg by mouth at bedtime as needed. Takes for anxiety      . buPROPion (WELLBUTRIN XL) 150 MG 24 hr tablet Take 150 mg by mouth daily.      . citalopram (CELEXA) 20 MG tablet Take 20 mg by mouth daily.      . diazepam (VALIUM) 5 MG tablet Take 5 mg by mouth every 6 (six) hours as needed.      . Ibuprofen-Famotidine (DUEXIS) 800-26.6 MG TABS Take 1 tablet by mouth daily.      Marland Kitchen oxyCODONE-acetaminophen (PERCOCET) 5-325 MG per tablet Take 1 tablet by mouth every 4 (four) hours as needed. Takes for pain      . progesterone (PROMETRIUM) 100 MG capsule Take 100 mg by mouth daily.         INSTRUCTIONS GIVEN AND DISCUSSED:  See attached schedule   SPECIAL INSTRUCTIONS/FOLLOW-UP:  See above.  I acknowledge that I have been informed and understand all the instructions given to me and received a copy.I know to contact the clinic, my physician, or go to the emergency Department if any problems should occur.   I do not have any more questions at this time, but understand that I may call the [facility] at [phone] during business hours should I have any further questions or need assistance in obtaining follow-up care.

## 2012-04-12 NOTE — Telephone Encounter (Signed)
gv and printed pt appt schedule for Jan 2014 °

## 2012-04-18 ENCOUNTER — Other Ambulatory Visit: Payer: Self-pay | Admitting: Family Medicine

## 2012-04-18 DIAGNOSIS — Z1231 Encounter for screening mammogram for malignant neoplasm of breast: Secondary | ICD-10-CM

## 2012-04-18 NOTE — Progress Notes (Signed)
CC:   Farris Has, MD, Fax [phone]  IDENTIFYING STATEMENT:  The a 51 year old woman seen at the request of Dr. Kateri Plummer with anemia.  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  She reports that she is presently perimenopausal, but reports a 17-month to 1-year history of menorrhagia. She received a D and C in July 2013.  She reports that an ultrasound had shown uterine fibroids.  She was placed on over-the-counter iron for a year.  Recent laboratory work had shown an improvement in her ferritin. She describes ongoing fatigue.  She describes generalized arthritic-type pain.  She tells me that she has been referred to see an orthopedic surgeon.  She also gives a history of weight loss of 14 pounds over a month.  She also gives a history of lower abdominal discomfort.  The menorrhagia has discontinued since undergoing a D and C.  She is prone to chronic constipation.  Her last colonoscopy was 2 years ago, performed as part of a study at [place] with patients with chronic constipation.  She reports noting blood on the surface of her stools. She remains prone to chronic constipation.  She also reports that she has been referred to [facility].  She denies hematuria.  Reviewed the recent CBC obtained from a doctor's office.  Of note, the white cell count is of 4.2, hemoglobin 9.8, hematocrit 30.7, platelets 162, vitamin B12 579, ferritin 2.8.  MEDICATIONS:  Xanax 0.5 mg at bedtime as needed, Wellbutrin XL 150 mg daily, Celexa 20 mg daily, Valium 5 mg every 6 hours as needed, Percocet 1 tablet every 4 hours as needed, Prometrium 100 mg daily, Phenergan 25 mg every 6 hours as needed, Senokot 8.6/50 as needed.  ALLERGIES:  None.  PAST MEDICAL HISTORY: 1. Migraine. 2. Depression. 3. Eczema. 4. Dermatitis. 5. History of multiple Bell's palsy. 6. Uterine fibroids. 7. Status post a D and C in 11/2011.  SOCIAL HISTORY:  The  patient is single, but has a significant other. She has 4 children.  She is a former smoker, smoked for 20 years. Drinks 1 or 2 beers a week.  She is an Charity fundraiser on the renal unit.  FAMILY HISTORY:  Pertinent in that her father had lung cancer.  A number of relatives on the mother's side had breast, uterine and cervical cancer.  Maternal great uncle had lung cancer.  Her maternal grandfather had brain cancer.  Patient's mother has anemia of undetermined etiology.  REVIEW OF SYSTEMS:  Constitutional:  Denies fever, chills, night sweats, anorexia, weight loss.  GI:  Denies hematochezia, hematemesis.  Notes lower abdominal discomfort.  She is prone to constipation.  Notes blood in the stools following straining.  Notes nausea.  Otherwise, a 15-point review of systems is negative.  PHYSICAL EXAMINATION:  General:  The patient is alert and oriented x3. Vital signs:  Pulse 112, blood pressure 121/95, temperature 97.7, respirations 20, weight 149 pounds.  HEENT:  Head is atraumatic, normocephalic.  Sclerae anicteric.  Pupils equal, round, reactive to light.  Mouth moist.  No thrush.  Neck:  Supple.  Chest:  Clear to percussion and auscultation.  CVS:  First and second heart sounds present.  No added sounds or murmurs.  Abdomen:  Soft.  No masses. Bowel sounds present.  Extremities:  No edema or calf tenderness.  Lymph nodes:  No palpable cervical, axillary, or inguinal lymphadenopathy. CNS:  Nonfocal.  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 She has menorrhagia, which may be the reason why.  However, I do agree with the upcoming GI evaluation.  I am also hoping that will also include an endoscopy.  I am concerned about her weight loss.  I scheduled a CT of the abdomen and pelvis.  Her ferritin levels are indeed low.  I will have it repeated.  In addition, we will obtain a hemolytic panel with Coombs and haptoglobin.  We will also obtain a urinalysis and a  reticulocyte count.  She will proceed per Heme this afternoon.  We discussed the logistics and side effects of therapy.  We will be telephoning her with results.  She follows up in 6-8 weeks' time or sooner if needed.    ______________________________ Laurice Record, M.D. LIO/MEDQ  D:  04/12/2012  T:  04/13/2012  Job:  [id number]

## 2012-04-19 LAB — IRON AND TIBC
%SAT: 8 % — ABNORMAL LOW (ref 20–55)
Iron: 30 ug/dL — ABNORMAL LOW (ref 42–145)
TIBC: 397 ug/dL (ref 250–470)
UIBC: 367 ug/dL (ref 125–400)

## 2012-04-19 LAB — VON WILLEBRAND PANEL
Coagulation Factor VIII: 293 % — ABNORMAL HIGH (ref 73–140)
Ristocetin Co-factor, Plasma: 131 % (ref 42–200)
Von Willebrand Antigen, Plasma: 158 % (ref 50–217)

## 2012-04-19 LAB — VITAMIN B12: Vitamin B-12: 923 pg/mL — ABNORMAL HIGH (ref 211–911)

## 2012-04-19 LAB — FERRITIN: Ferritin: 4 ng/mL — ABNORMAL LOW (ref 10–291)

## 2012-04-19 LAB — FOLATE: Folate: 12.3 ng/mL (ref >5.4)

## 2012-05-07 ENCOUNTER — Telehealth: Payer: Self-pay | Admitting: Oncology

## 2012-05-07 NOTE — Telephone Encounter (Signed)
called home and mobile # and lvm to advise on rescheduled appt told pt to call monday after 9:00am....former [facility] pt reassigned to [facility]

## 2012-05-10 ENCOUNTER — Telehealth: Payer: Self-pay | Admitting: Oncology

## 2012-05-10 NOTE — Telephone Encounter (Signed)
Talked to patient and she is aware of appt for 06/10/12

## 2012-05-10 NOTE — Telephone Encounter (Signed)
Called patient and left message for 06/14/12 lab and ML

## 2012-05-21 ENCOUNTER — Encounter: Payer: Self-pay | Admitting: Oncology

## 2012-05-24 ENCOUNTER — Other Ambulatory Visit: Payer: 59 | Admitting: Lab

## 2012-05-26 ENCOUNTER — Ambulatory Visit: Payer: 59 | Admitting: Hematology and Oncology

## 2012-05-26 ENCOUNTER — Ambulatory Visit: Payer: 59 | Admitting: Nurse Practitioner

## 2012-05-27 ENCOUNTER — Ambulatory Visit
Admission: RE | Admit: 2012-05-27 | Discharge: 2012-05-27 | Disposition: A | Discharge status: Home / Self Care | Payer: 59 | Source: Ambulatory Visit | Attending: Family Medicine | Admitting: Family Medicine

## 2012-05-27 DIAGNOSIS — Z1231 Encounter for screening mammogram for malignant neoplasm of breast: Secondary | ICD-10-CM

## 2012-06-02 ENCOUNTER — Other Ambulatory Visit: Payer: Self-pay | Admitting: Family Medicine

## 2012-06-02 DIAGNOSIS — R928 Other abnormal and inconclusive findings on diagnostic imaging of breast: Secondary | ICD-10-CM

## 2012-06-07 ENCOUNTER — Ambulatory Visit
Admission: RE | Admit: 2012-06-07 | Discharge: 2012-06-07 | Disposition: A | Discharge status: Home / Self Care | Payer: 59 | Source: Ambulatory Visit | Attending: Family Medicine | Admitting: Family Medicine

## 2012-06-07 DIAGNOSIS — R928 Other abnormal and inconclusive findings on diagnostic imaging of breast: Secondary | ICD-10-CM

## 2012-06-10 ENCOUNTER — Ambulatory Visit: Payer: 59 | Admitting: Nurse Practitioner

## 2012-06-10 ENCOUNTER — Other Ambulatory Visit: Payer: 59 | Admitting: Lab

## 2012-06-14 ENCOUNTER — Ambulatory Visit: Payer: 59 | Admitting: Nurse Practitioner

## 2012-06-14 ENCOUNTER — Other Ambulatory Visit: Payer: Self-pay | Admitting: *Deleted

## 2012-06-14 ENCOUNTER — Other Ambulatory Visit: Payer: 59 | Admitting: Lab

## 2012-06-14 ENCOUNTER — Telehealth: Payer: Self-pay | Admitting: Oncology

## 2012-06-14 DIAGNOSIS — D539 Nutritional anemia, unspecified: Secondary | ICD-10-CM

## 2012-06-14 NOTE — Telephone Encounter (Signed)
pt called in to to r/s 1/28 appt

## 2012-06-30 ENCOUNTER — Telehealth: Payer: Self-pay | Admitting: *Deleted

## 2012-06-30 NOTE — Telephone Encounter (Signed)
Patient plans to make her appointment tomorrow. She just got home from work today.

## 2012-07-01 ENCOUNTER — Other Ambulatory Visit (HOSPITAL_BASED_OUTPATIENT_CLINIC_OR_DEPARTMENT_OTHER): Payer: 59

## 2012-07-01 ENCOUNTER — Ambulatory Visit (HOSPITAL_BASED_OUTPATIENT_CLINIC_OR_DEPARTMENT_OTHER): Payer: 59 | Admitting: Nurse Practitioner

## 2012-07-01 VITALS — BP 99/65 | HR 96 | Temp 98.8°F | Resp 20 | Ht 68.0 in | Wt 160.7 lb

## 2012-07-01 DIAGNOSIS — D539 Nutritional anemia, unspecified: Secondary | ICD-10-CM

## 2012-07-01 DIAGNOSIS — D509 Iron deficiency anemia, unspecified: Secondary | ICD-10-CM

## 2012-07-01 LAB — CBC WITH DIFFERENTIAL/PLATELET
BASO%: 1 % (ref 0.0–2.0)
Basophils Absolute: 0.1 10*3/uL (ref 0.0–0.1)
EOS%: 1.5 % (ref 0.0–7.0)
Eosinophils Absolute: 0.1 10*3/uL (ref 0.0–0.5)
HCT: 39.3 % (ref 34.8–46.6)
HGB: 13.2 g/dL (ref 11.6–15.9)
LYMPH%: 29.5 % (ref 14.0–49.7)
MCH: 29.4 pg (ref 25.1–34.0)
MCHC: 33.5 g/dL (ref 31.5–36.0)
MCV: 88 fL (ref 79.5–101.0)
MONO#: 0.8 10*3/uL (ref 0.1–0.9)
MONO%: 11.9 % (ref 0.0–14.0)
NEUT#: 3.6 10*3/uL (ref 1.5–6.5)
NEUT%: 56.1 % (ref 38.4–76.8)
Platelets: 208 10*3/uL (ref 145–400)
RBC: 4.47 10*6/uL (ref 3.70–5.45)
RDW: 17 % — ABNORMAL HIGH (ref 11.2–14.5)
WBC: 6.4 10*3/uL (ref 3.9–10.3)
lymph#: 1.9 10*3/uL (ref 0.9–3.3)

## 2012-07-01 LAB — FERRITIN: Ferritin: 53 ng/mL (ref 10–291)

## 2012-07-01 NOTE — Progress Notes (Signed)
OFFICE PROGRESS NOTE  Interval history:  Krista Osborn is a 52 year old woman previously followed by Dr. Dalene Carrow for iron deficiency anemia. Labs done through the primary providers office on 03/21/2012 showed a hemoglobin of 9.8, MCV 74 and ferritin 2.8. She was seen in an initial visit by Dr. Dalene Carrow on 04/12/2012. Labs at that time showed a hemoglobin of 12.7, MCV 76, ferritin 4, white count 6.7, platelet count 234,000, iron 30, percent saturation 8, normal BUN and creatinine, B12 923, folate 12. A urinalysis was negative for blood. Von Willebrand panel as follows: Factor VIII 293% (range 73-140), von Willebrand antigen normal at 158 and ristocetin cofactor normal at 131. She was given IV iron in the form of Feraheme on 04/12/2012.  The iron deficiency anemia was attributed to menorrhagia. At the time of her first visit with Dr. Dalene Carrow she reported a 6 month to one year history of menorrhagia. She underwent a hysteroscopy/myomectomy with uterine fibroid removal on 01/11/2012. The bleeding resolved for 2 months post procedure.   Krista Osborn reports consistently taking oral iron from the time she had the lab work done on 03/21/2012 to the time of her first visit here on 04/12/2012. She denies any problems tolerating the oral iron.  She continues to have vaginal bleeding. The flow alternates "heavy to light". She has mild intermittent rectal bleeding related to a "skin tag". She reports having a colonoscopy 3 years ago. She reports a screening colonoscopy is planned in the near future. Appetite is "okay". Her weight is stable. She has periodic abdominal cramps which has been a chronic problem. No fevers. She has periodic hot flashes. She continues to be fatigued. No shortness of breath or cough. No nausea or vomiting. No hematuria. She has chronic head, shoulder and back pain. She is followed at a pain clinic. She has a history of migraine headaches  Medications were reviewed.  No known drug  allergies.  Father deceased with cancer, unknown primary. Mother reported to be in good health. Brother in good health.  She lives in [place]. She is in a committed relationship. She has 4 children. She reports her sons have obsessive-compulsive disorder and bipolar disorder. She is employed as a Engineer, civil (consulting). She quit smoking approximately 15 years ago at one pack per day for 15 years. She reports weekly alcohol intake with wine, beer or mixed drinks.   Objective: Blood pressure 99/65, pulse 96, temperature 98.8 F (37.1 C), temperature source Oral, resp. rate 20, height 5\' 8"  (1.727 m), weight 160 lb 11.2 oz (72.893 kg).  Oropharynx is without thrush or ulceration. No palpable cervical or supraclavicular lymph nodes. Lungs are clear. Regular cardiac rhythm. Abdomen is soft and nontender. No organomegaly. Extremities are without edema. Calves are soft and nontender  Lab Results: Lab Results  Component Value Date   WBC 6.4 07/01/2012   HGB 13.2 07/01/2012   HCT 39.3 07/01/2012   MCV 88.0 07/01/2012   PLT 208 07/01/2012    Chemistry:    Chemistry      Component Value Date/Time   NA 140 04/12/2012 1214   NA 135 08/29/2007 1335   K 4.6 04/12/2012 1214   K 3.5 08/29/2007 1335   CL 101 04/12/2012 1214   CL 103 08/29/2007 1335   CO2 29 04/12/2012 1214   CO2 25 08/29/2007 1335   BUN 14.0 04/12/2012 1214   BUN 13 08/29/2007 1335   CREATININE 0.9 04/12/2012 1214   CREATININE 0.69 08/29/2007 1335      Component Value Date/Time  CALCIUM 10.4 04/12/2012 1214   CALCIUM 8.9 08/29/2007 1335   ALKPHOS 79 04/12/2012 1214   ALKPHOS 68 08/29/2007 1335   AST 18 04/12/2012 1214   AST 23 08/29/2007 1335   ALT 12 04/12/2012 1214   ALT 16 08/29/2007 1335   BILITOT 0.32 04/12/2012 1214   BILITOT 0.6 08/29/2007 1335       Studies/Results: [employer] R  06/07/2012  *RADIOLOGY REPORT*  Clinical Data:  Recall from screening mammography.  DIGITAL DIAGNOSTIC RIGHT BREAST MAMMOGRAM  Comparison:  05/27/2012 and 01/03/2010 studies.  Findings:  ACR Breast Density Category 3: The breast tissue is heterogeneously dense.  Magnification views of the right breast demonstrate  several scattered clusters of punctate calcifications.  There are also similar calcifications present within the lateral portion of the right breast and scattered throughout the left breast.  Several of the calcifications located medially within the right breast may layer on the erect lateral magnification views. The calcifications are not well visualized on the prior study.  This may be due to the technical differences between the examinations.  There are no worrisome linear or branching forms.  I have discussed the findings with the patient and recommend follow-up mammography at six, 12, at 24 months.  IMPRESSION: Probably benign calcifications located within the lower inner quadrant right breast.  Recommend follow-up right breast diagnostic mammogram in 6 months.  RECOMMENDATION: Right breast diagnostic mammogram in 6 months.  I have discussed the findings and recommendations with the patient. Results were also provided in writing at the conclusion of the visit.  BI-RADS CATEGORY 3:  Probably benign finding(s) - short interval follow-up suggested.   Original Report Authenticated By: Rolla Plate, M.D.     Medications: I have reviewed the patient's current medications.  Assessment/Plan:  1. Anemia with documented iron deficiency status post IV iron on 04/12/2012. 2. Vaginal bleeding status post hysteroscopy, myomectomy with fibroid removal 01/11/2012 with initial resolution of vaginal bleeding. The bleeding has resumed. 3. Migraine headaches. 4. Chronic head, shoulder and back pain.  Disposition-Krista Osborn's hemoglobin has corrected into normal range as has the MCV. We will followup on the ferritin from today.  She continues to have vaginal bleeding. Dr. Truett Perna recommends she resume ferrous sulfate twice daily and  followup with gynecology regarding the persistent bleeding.   She has followup with GI regarding a screening colonoscopy.  We did not schedule formal followup in our office. Dr. Truett Perna recommends a followup CBC and ferritin in 3-4 months with her primary care provider. We will defer the duration of oral iron therapy to her primary physician once the vaginal bleeding has been resolved. We will be happy to see her in the future as needed.   Patient seen with Dr. Truett Perna.  Krista Osborn ANP/GNP-BC

## 2012-07-02 ENCOUNTER — Other Ambulatory Visit: Payer: Self-pay

## 2012-10-08 ENCOUNTER — Other Ambulatory Visit: Payer: Self-pay | Admitting: Internal Medicine

## 2013-01-02 ENCOUNTER — Ambulatory Visit (HOSPITAL_COMMUNITY)
Admission: RE | Admit: 2013-01-02 | Discharge: 2013-01-02 | Disposition: A | Discharge status: Home / Self Care | Payer: 59 | Source: Ambulatory Visit | Attending: Pain Medicine | Admitting: Pain Medicine

## 2013-01-02 ENCOUNTER — Other Ambulatory Visit (HOSPITAL_COMMUNITY): Payer: Self-pay | Admitting: Pain Medicine

## 2013-01-02 DIAGNOSIS — R52 Pain, unspecified: Secondary | ICD-10-CM

## 2013-01-02 DIAGNOSIS — M25519 Pain in unspecified shoulder: Secondary | ICD-10-CM | POA: Insufficient documentation

## 2013-01-02 DIAGNOSIS — Z8739 Personal history of other diseases of the musculoskeletal system and connective tissue: Secondary | ICD-10-CM | POA: Insufficient documentation

## 2013-11-25 IMAGING — CR DG SHOULDER 2+V*R*
3 series · 3 of 3 positions shown · non-contrast
Comparison: None.

CLINICAL DATA: History of osteoarthritis, increased right shoulder
pain

RIGHT SHOULDER - 2+ VIEW

[w shoulder internal right]
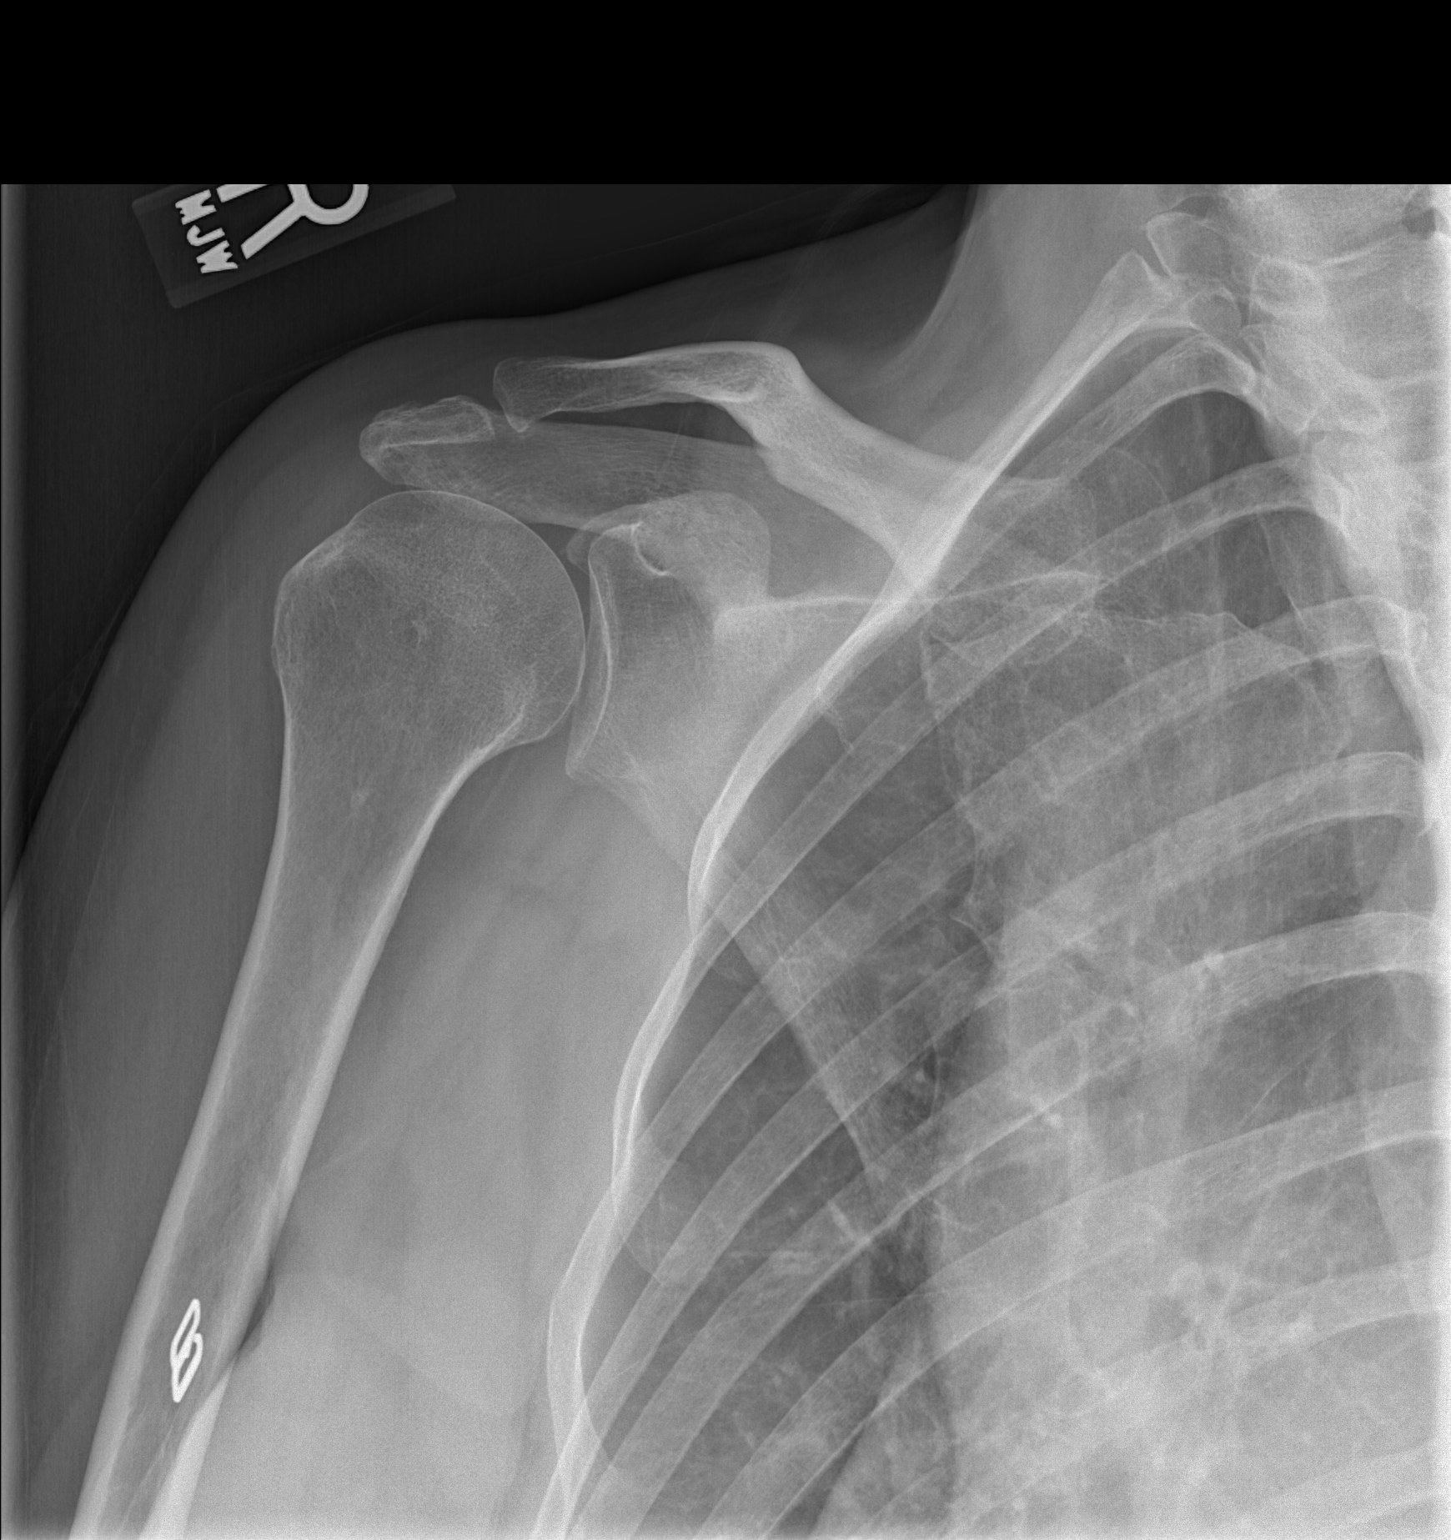

[w shoulder y-view right]
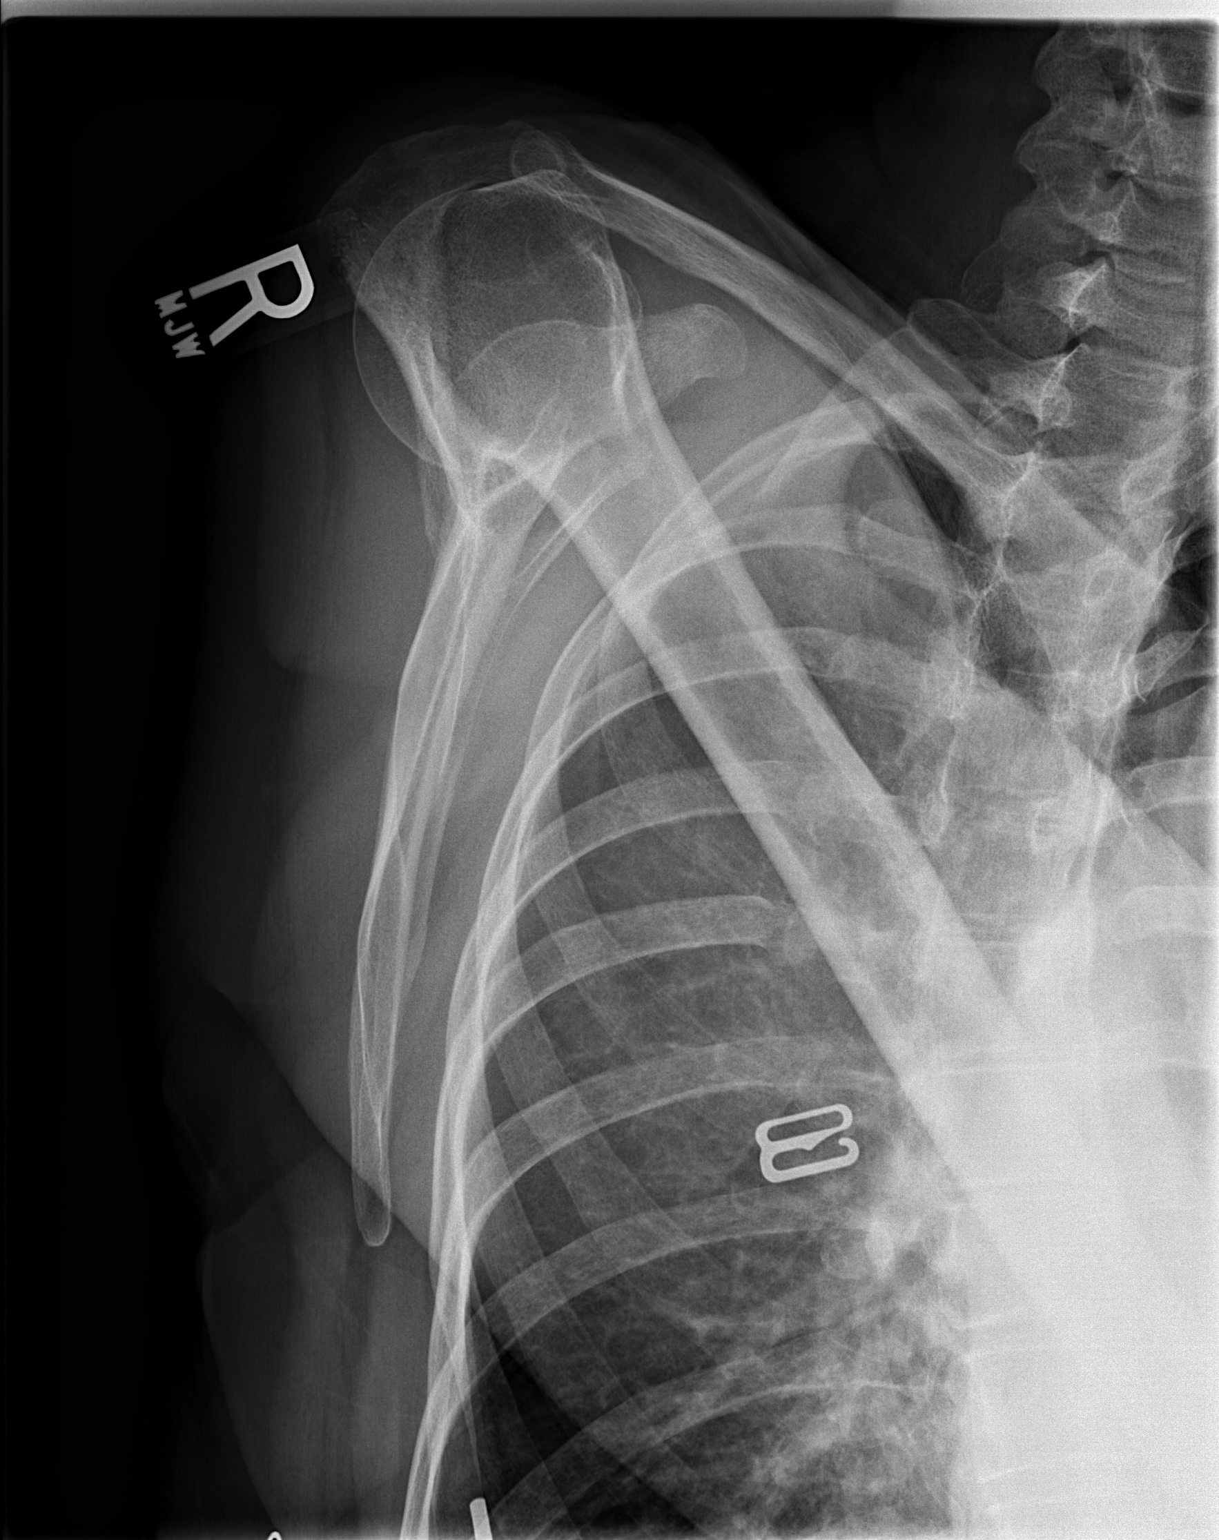

[x shoulder ap right]
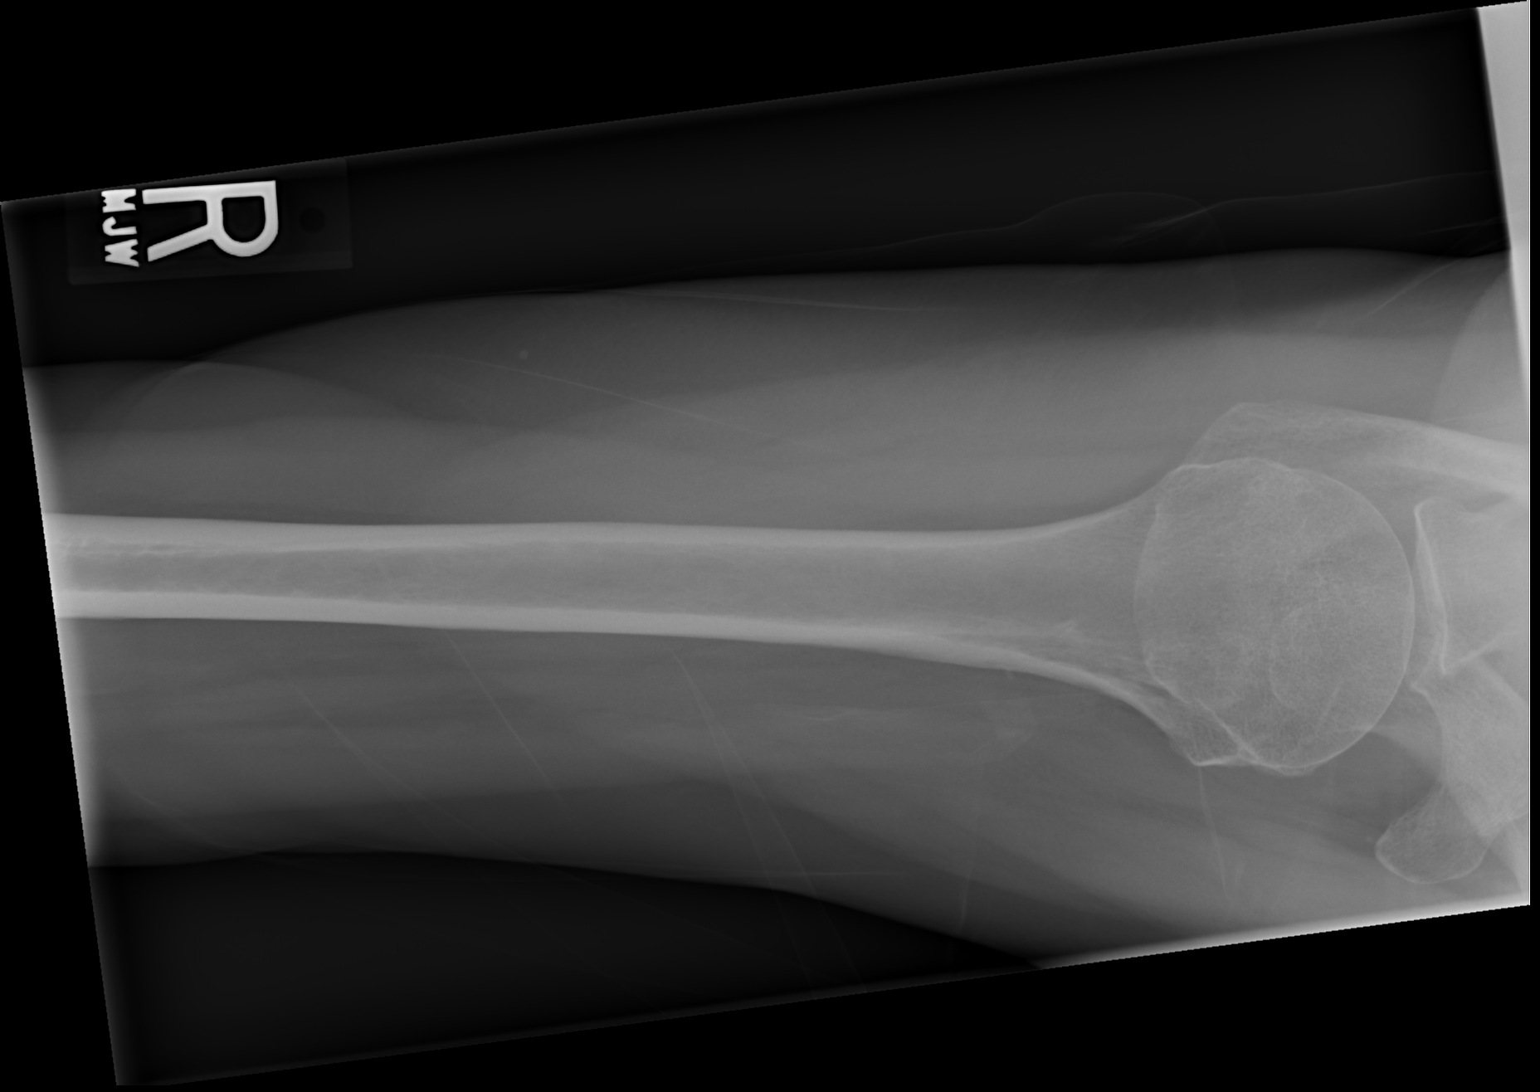

[3 of 3 positions shown; findings below may reference images not displayed]

FINDINGS: No fracture or dislocation.  No glenohumeral joint space
narrowing or osteophyte formation.
IMPRESSION: No significant glenohumeral abnormality.

## 2014-03-19 ENCOUNTER — Encounter: Payer: Self-pay | Admitting: Hematology and Oncology

## 2020-09-21 ENCOUNTER — Other Ambulatory Visit: Payer: Self-pay

## 2020-09-21 ENCOUNTER — Encounter (HOSPITAL_COMMUNITY): Payer: Self-pay

## 2020-09-21 ENCOUNTER — Emergency Department (HOSPITAL_COMMUNITY): Payer: PRIVATE HEALTH INSURANCE

## 2020-09-21 ENCOUNTER — Emergency Department (HOSPITAL_COMMUNITY)
Admission: EM | Admit: 2020-09-21 | Discharge: 2020-09-21 | Disposition: A | Discharge status: Home / Self Care | Payer: PRIVATE HEALTH INSURANCE | Attending: Emergency Medicine | Admitting: Emergency Medicine

## 2020-09-21 DIAGNOSIS — R079 Chest pain, unspecified: Secondary | ICD-10-CM | POA: Insufficient documentation

## 2020-09-21 DIAGNOSIS — Z87891 Personal history of nicotine dependence: Secondary | ICD-10-CM | POA: Insufficient documentation

## 2020-09-21 DIAGNOSIS — M79631 Pain in right forearm: Secondary | ICD-10-CM | POA: Insufficient documentation

## 2020-09-21 MED ORDER — ACETAMINOPHEN 325 MG PO TABS
650.0000 mg | ORAL_TABLET | Freq: Once | ORAL | Status: AC
  Administered 2020-09-21: 650 mg via ORAL
  Filled 2020-09-21: qty 2

## 2020-09-21 MED ORDER — LIDOCAINE 5 % EX PTCH
1.0000 | MEDICATED_PATCH | Freq: Once | CUTANEOUS | Status: DC
  Administered 2020-09-21: 1 via TRANSDERMAL
  Filled 2020-09-21: qty 1

## 2020-09-21 MED ORDER — IBUPROFEN 400 MG PO TABS
600.0000 mg | ORAL_TABLET | Freq: Once | ORAL | Status: AC
  Administered 2020-09-21: 600 mg via ORAL
  Filled 2020-09-21: qty 1

## 2020-09-21 NOTE — Progress Notes (Signed)
Orthopedic Tech Progress Note Patient Details:  CASSANDR CEDERBERG 10/17/1960 [id number]  Ortho Devices Type of Ortho Device: Velcro wrist splint Ortho Device/Splint Location: rue Ortho Device/Splint Interventions: Ordered,Application,Adjustment   Post Interventions Patient Tolerated: Well Instructions Provided: Care of device,Adjustment of device   Karolee Stamps 09/21/2020, 11:29 PM
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
## 2020-09-21 NOTE — ED Notes (Signed)
Patient verbalizes understanding of discharge instructions. Opportunity for questioning and answers were provided. Wrist brace placed by ortho tech before departure. Armband removed by staff, pt discharged from ED ambulatory.'
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
## 2022-04-30 ENCOUNTER — Encounter: Payer: Self-pay | Admitting: Hematology and Oncology

## 2022-05-02 ENCOUNTER — Encounter: Payer: Self-pay | Admitting: Hematology and Oncology
